# Patient Record
Sex: Female | Born: 2000 | Race: Black or African American | Hispanic: No | Marital: Single | Smoking: Never smoker
Health system: Southern US, Community
[De-identification: ages and names within clinical notes are randomized; demographics above are authoritative.]

## PROBLEM LIST (undated history)

## (undated) ENCOUNTER — Inpatient Hospital Stay (HOSPITAL_COMMUNITY): Payer: Self-pay

## (undated) DIAGNOSIS — J45909 Unspecified asthma, uncomplicated: Secondary | ICD-10-CM

## (undated) DIAGNOSIS — N83209 Unspecified ovarian cyst, unspecified side: Secondary | ICD-10-CM

## (undated) DIAGNOSIS — F32A Depression, unspecified: Secondary | ICD-10-CM

## (undated) HISTORY — PX: NO PAST SURGERIES: SHX2092

## (undated) HISTORY — PX: ECTOPIC PREGNANCY SURGERY: SHX613

---
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
## 2021-01-12 ENCOUNTER — Ambulatory Visit (INDEPENDENT_AMBULATORY_CARE_PROVIDER_SITE_OTHER): Payer: Medicaid Other

## 2021-01-12 DIAGNOSIS — Z659 Problem related to unspecified psychosocial circumstances: Secondary | ICD-10-CM

## 2021-01-12 DIAGNOSIS — O0930 Supervision of pregnancy with insufficient antenatal care, unspecified trimester: Secondary | ICD-10-CM

## 2021-01-12 DIAGNOSIS — Z3402 Encounter for supervision of normal first pregnancy, second trimester: Secondary | ICD-10-CM

## 2021-01-12 DIAGNOSIS — Z3A00 Weeks of gestation of pregnancy not specified: Secondary | ICD-10-CM

## 2021-01-12 DIAGNOSIS — Z3490 Encounter for supervision of normal pregnancy, unspecified, unspecified trimester: Secondary | ICD-10-CM | POA: Insufficient documentation

## 2021-01-12 DIAGNOSIS — Z3400 Encounter for supervision of normal first pregnancy, unspecified trimester: Secondary | ICD-10-CM

## 2021-01-12 MED ORDER — VITAFOL GUMMIES 3.33-0.333-34.8 MG PO CHEW: 1.0000 | CHEWABLE_TABLET | Freq: Every day | ORAL | 11 refills | Status: DC

## 2021-01-12 MED ORDER — BLOOD PRESSURE KIT DEVI: 1.0000 | 0 refills | Status: DC | PRN

## 2021-01-12 NOTE — Progress Notes (Signed)
New OB Intake  I connected with  Brianna Thornton on 01/12/21 at  9:00 AM EDT by telephone visit and verified that I am speaking with the correct person using two identifiers. Nurse is located at [facility] and pt is located at home.  I discussed the limitations, risks, security and privacy concerns of performing an evaluation and management service by telephone and the availability of in person appointments. I also discussed with the patient that there may be a patient responsible charge related to this service. The patient expressed understanding and agreed to proceed.  I explained I am completing New OB Intake today. We discussed her EDD of 06/24/2021 that is based [facility]. Pt will bring ultrasound results to NOB visit. Pt is G1/P0. I reviewed her allergies, medications, Medical/Surgical/OB history, and appropriate screenings. Based on history, this pregnancy is uncomplicated .   Patient Active Problem List   Diagnosis Date Noted   Episodic tension-type headache, not intractable 12/14/2017   Seizure-like activity (HCC) 12/13/2017   Ingrown toenail 03/02/2016    Concerns addressed today:  PHQ9= 7 GAD7= 2 Pt requests counseling services.  Pt needs PNV Rx  Delivery Plans:  Plans to deliver at [facility].   MyChart/Babyscripts MyChart access verified. I explained pt will have some visits in office and some virtually. Babyscripts instructions given and order placed. Patient verifies receipt of registration text/e-mail. Account successfully created and app downloaded.  Blood Pressure Cuff  Blood pressure cuff ordered for patient to pick-up from [employer]. Explained after first prenatal appt pt will check weekly and document in Babyscripts.  Weight scale: Patient does have weight scale.   Anatomy US Explained first scheduled [place] will be around 19 weeks.   Labs Discussed Avelina Laine genetic screening with patient. Would like Panorama drawn at new OB  visit. Routine prenatal labs needed.  Covid Vaccine Patient has covid vaccine.   Social Determinants of Health Food Insecurity: Patient denies food insecurity. WIC Referral: Patient is not interested in referral to [employer].  Transportation: Patient denies transportation needs. Childcare: Discussed no children allowed at ultrasound appointments. Offered childcare services; patient declines childcare services at this time.  Send link to Pregnancy Navigators [facility] [employer] [facility] has a partnership with the [employer] to provide prenatal navigation for the most needed resources in our community. In order to see how we can help connect you to these resources we need consent to contact you. Please complete the very short consent using the link below:   English Link: [email or web address].tfaforms.[email or web address]?[email or web address]   Placed OB Box on problem list and updated  First visit review I reviewed new OB appt with pt. I explained she will have a pelvic exam, ob bloodwork with genetic screening. Explained that patient will be seen by pregnancy navigator following visit with provider. [facility] information placed in AVS.   Dalphine Handing, CMA 01/12/2021  9:22 AM

## 2021-01-16 NOTE — Progress Notes (Signed)
Patient was assessed and managed by nursing staff during this encounter. I have reviewed the chart and agree with the documentation and plan. I have also made any necessary editorial changes.  [name], MD 01/16/2021 10:51 AM

## 2021-01-19 ENCOUNTER — Other Ambulatory Visit: Payer: Self-pay

## 2021-01-19 ENCOUNTER — Other Ambulatory Visit (HOSPITAL_COMMUNITY)
Admission: RE | Admit: 2021-01-19 | Discharge: 2021-01-19 | Disposition: A | Discharge status: Home / Self Care | Payer: Medicaid Other | Source: Ambulatory Visit | Attending: Obstetrics and Gynecology | Admitting: Obstetrics and Gynecology

## 2021-01-19 ENCOUNTER — Encounter: Payer: Self-pay | Admitting: Obstetrics and Gynecology

## 2021-01-19 ENCOUNTER — Ambulatory Visit (INDEPENDENT_AMBULATORY_CARE_PROVIDER_SITE_OTHER): Payer: Medicaid Other | Admitting: Obstetrics and Gynecology

## 2021-01-19 VITALS — BP 95/57 | HR 88 | Wt 95.6 lb

## 2021-01-19 DIAGNOSIS — Z3400 Encounter for supervision of normal first pregnancy, unspecified trimester: Secondary | ICD-10-CM

## 2021-01-19 DIAGNOSIS — Z3A17 17 weeks gestation of pregnancy: Secondary | ICD-10-CM | POA: Diagnosis not present

## 2021-01-19 NOTE — Patient Instructions (Signed)
Second Trimester of Pregnancy °The second trimester of pregnancy is from week 13 through week 27. This is months 4 through 6 of pregnancy. The second trimester is often a time when you feel your best. Your body has adjusted to being pregnant, and you begin to feel better physically. °During the second trimester: °Morning sickness has lessened or stopped completely. °You may have more energy. °You may have an increase in appetite. °The second trimester is also a time when the unborn baby (fetus) is growing rapidly. At the end of the sixth month, the fetus may be up to 12 inches long and weigh about 1½ pounds. You will likely begin to feel the baby move (quickening) between 16 and 20 weeks of pregnancy. °Body changes during your second trimester °Your body continues to go through many changes during your second trimester. The changes vary and generally return to normal after the baby is born. °Physical changes °Your weight will continue to increase. You will notice your lower abdomen bulging out. °You may begin to get stretch marks on your hips, abdomen, and breasts. °Your breasts will continue to grow and to become tender. °Dark spots or blotches (chloasma or mask of pregnancy) may develop on your face. °A dark line from your belly button to the pubic area (linea nigra) may appear. °You may have changes in your hair. These can include thickening of your hair, rapid growth, and changes in texture. Some people also have hair loss during or after pregnancy, or hair that feels dry or thin. °Health changes °You may develop headaches. °You may have heartburn. °You may develop constipation. °You may develop hemorrhoids or swollen, bulging veins (varicose veins). °Your gums may bleed and may be sensitive to brushing and flossing. °You may urinate more often because the fetus is pressing on your bladder. °You may have back pain. This is caused by: °Weight gain. °Pregnancy hormones that are relaxing the joints in your  pelvis. °A shift in weight and the muscles that support your balance. °Follow these instructions at home: °Medicines °Follow your health care provider's instructions regarding medicine use. Specific medicines may be either safe or unsafe to take during pregnancy. Do not take any medicines unless approved by your health care provider. °Take a prenatal vitamin that contains at least 600 micrograms (mcg) of folic acid. °Eating and drinking °Eat a healthy diet that includes fresh fruits and vegetables, whole grains, good sources of protein such as meat, eggs, or tofu, and low-fat dairy products. °Avoid raw meat and unpasteurized juice, milk, and cheese. These carry germs that can harm you and your baby. °You may need to take these actions to prevent or treat constipation: °Drink enough fluid to keep your urine pale yellow. °Eat foods that are high in fiber, such as beans, whole grains, and fresh fruits and vegetables. °Limit foods that are high in fat and processed sugars, such as fried or sweet foods. °Activity °Exercise only as directed by your health care provider. Most people can continue their usual exercise routine during pregnancy. Try to exercise for 30 minutes at least 5 days a week. Stop exercising if you develop contractions in your uterus. °Stop exercising if you develop pain or cramping in the lower abdomen or lower back. °Avoid exercising if it is very hot or humid or if you are at a high altitude. °Avoid heavy lifting. °If you choose to, you may have sex unless your health care provider tells you not to. °Relieving pain and discomfort °Wear a supportive bra   to prevent discomfort from breast tenderness. °Take warm sitz baths to soothe any pain or discomfort caused by hemorrhoids. Use hemorrhoid cream if your health care provider approves. °Rest with your legs raised (elevated) if you have leg cramps or low back pain. °If you develop varicose veins: °Wear support hose as told by your health care  provider. °Elevate your feet for 15 minutes, 3-4 times a day. °Limit salt in your diet. °Safety °Wear your seat belt at all times when driving or riding in a car. °Talk with your health care provider if someone is verbally or physically abusive to you. °Lifestyle °Do not use hot tubs, steam rooms, or saunas. °Do not douche. Do not use tampons or scented sanitary pads. °Avoid cat litter boxes and soil used by cats. These carry germs that can cause birth defects in the baby and possibly loss of the fetus by miscarriage or stillbirth. °Do not use herbal remedies, alcohol, illegal drugs, or medicines that are not approved by your health care provider. Chemicals in these products can harm your baby. °Do not use any products that contain nicotine or tobacco, such as cigarettes, e-cigarettes, and chewing tobacco. If you need help quitting, ask your health care provider. °General instructions °During a routine prenatal visit, your health care provider will do a physical exam and other tests. He or she will also discuss your overall health. Keep all follow-up visits. This is important. °Ask your health care provider for a referral to a local prenatal education class. °Ask for help if you have counseling or nutritional needs during pregnancy. Your health care provider can offer advice or refer you to specialists for help with various needs. °Where to find more information °American Pregnancy Association: americanpregnancy.org °American College of Obstetricians and Gynecologists: acog.org/en/Womens%20Health/Pregnancy °Office on Women's Health: womenshealth.gov/pregnancy °Contact a health care provider if you have: °A headache that does not go away when you take medicine. °Vision changes or you see spots in front of your eyes. °Mild pelvic cramps, pelvic pressure, or nagging pain in the abdominal area. °Persistent nausea, vomiting, or diarrhea. °A bad-smelling vaginal discharge or foul-smelling urine. °Pain when you  urinate. °Sudden or extreme swelling of your face, hands, ankles, feet, or legs. °A fever. °Get help right away if you: °Have fluid leaking from your vagina. °Have spotting or bleeding from your vagina. °Have severe abdominal cramping or pain. °Have difficulty breathing. °Have chest pain. °Have fainting spells. °Have not felt your baby move for the time period told by your health care provider. °Have new or increased pain, swelling, or redness in an arm or leg. °Summary °The second trimester of pregnancy is from week 13 through week 27 (months 4 through 6). °Do not use herbal remedies, alcohol, illegal drugs, or medicines that are not approved by your health care provider. Chemicals in these products can harm your baby. °Exercise only as directed by your health care provider. Most people can continue their usual exercise routine during pregnancy. °Keep all follow-up visits. This is important. °This information is not intended to replace advice given to you by your health care provider. Make sure you discuss any questions you have with your health care provider. °Document Revised: 08/19/2019 Document Reviewed: 06/25/2019 °Elsevier Patient Education © 2022 [employer] ° °Contraception Choices °Contraception, also called birth control, refers to methods or devices that prevent pregnancy. °Hormonal methods °Contraceptive implant °A contraceptive implant is a thin, plastic tube that contains a hormone that prevents pregnancy. It is different from an intrauterine device (IUD). It   is inserted into the upper part of the arm by a health care provider. Implants can be effective for up to 3 years. °Progestin-only injections °Progestin-only injections are injections of progestin, a synthetic form of the hormone progesterone. They are given every 3 months by a health care provider. °Birth control pills °Birth control pills are pills that contain hormones that prevent pregnancy. They must be taken once a day, preferably at the  same time each day. A prescription is needed to use this method of contraception. °Birth control patch °The birth control patch contains hormones that prevent pregnancy. It is placed on the skin and must be changed once a week for three weeks and removed on the fourth week. A prescription is needed to use this method of contraception. °Vaginal ring °A vaginal ring contains hormones that prevent pregnancy. It is placed in the vagina for three weeks and removed on the fourth week. After that, the process is repeated with a new ring. A prescription is needed to use this method of contraception. °Emergency contraceptive °Emergency contraceptives prevent pregnancy after unprotected sex. They come in pill form and can be taken up to 5 days after sex. They work best the sooner they are taken after having sex. Most emergency contraceptives are available without a prescription. This method should not be used as your only form of birth control. °Barrier methods °Female condom °A female condom is a thin sheath that is worn over the penis during sex. Condoms keep sperm from going inside a woman's body. They can be used with a sperm-killing substance (spermicide) to increase their effectiveness. They should be thrown away after one use. °Female condom °A female condom is a soft, loose-fitting sheath that is put into the vagina before sex. The condom keeps sperm from going inside a woman's body. They should be thrown away after one use. °Diaphragm °A diaphragm is a soft, dome-shaped barrier. It is inserted into the vagina before sex, along with a spermicide. The diaphragm blocks sperm from entering the uterus, and the spermicide kills sperm. A diaphragm should be left in the vagina for 6-8 hours after sex and removed within 24 hours. °A diaphragm is prescribed and fitted by a health care provider. A diaphragm should be replaced every 1-2 years, after giving birth, after gaining more than 15 lb (6.8 kg), and after pelvic  surgery. °Cervical cap °A cervical cap is a round, soft latex or plastic cup that fits over the cervix. It is inserted into the vagina before sex, along with spermicide. It blocks sperm from entering the uterus. The cap should be left in place for 6-8 hours after sex and removed within 48 hours. A cervical cap must be prescribed and fitted by a health care provider. It should be replaced every 2 years. °Sponge °A sponge is a soft, circular piece of polyurethane foam with spermicide in it. The sponge helps block sperm from entering the uterus, and the spermicide kills sperm. To use it, you make it wet and then insert it into the vagina. It should be inserted before sex, left in for at least 6 hours after sex, and removed and thrown away within 30 hours. °Spermicides °Spermicides are chemicals that kill or block sperm from entering the cervix and uterus. They can come as a cream, jelly, suppository, foam, or tablet. A spermicide should be inserted into the vagina with an applicator at least 10-15 minutes before sex to allow time for it to work. The process must be repeated every time   you have sex. Spermicides do not require a prescription. °Intrauterine contraception °Intrauterine device (IUD) °An IUD is a T-shaped device that is put in a woman's uterus. There are two types: °Hormone IUD.This type contains progestin, a synthetic form of the hormone progesterone. This type can stay in place for 3-5 years. °Copper IUD.This type is wrapped in copper wire. It can stay in place for 10 years. °Permanent methods of contraception °Female tubal ligation °In this method, a woman's fallopian tubes are sealed, tied, or blocked during surgery to prevent eggs from traveling to the uterus. °Hysteroscopic sterilization °In this method, a small, flexible insert is placed into each fallopian tube. The inserts cause scar tissue to form in the fallopian tubes and block them, so sperm cannot reach an egg. The procedure takes about 3  months to be effective. Another form of birth control must be used during those 3 months. °Female sterilization °This is a procedure to tie off the tubes that carry sperm (vasectomy). After the procedure, the man can still ejaculate fluid (semen). Another form of birth control must be used for 3 months after the procedure. °Natural planning methods °Natural family planning °In this method, a couple does not have sex on days when the woman could become pregnant. °Calendar method °In this method, the woman keeps track of the length of each menstrual cycle, identifies the days when pregnancy can happen, and does not have sex on those days. °Ovulation method °In this method, a couple avoids sex during ovulation. °Symptothermal method °This method involves not having sex during ovulation. The woman typically checks for ovulation by watching changes in her temperature and in the consistency of cervical mucus. °Post-ovulation method °In this method, a couple waits to have sex until after ovulation. °Where to find more information °Centers for Disease Control and Prevention: www.cdc.gov °Summary °Contraception, also called birth control, refers to methods or devices that prevent pregnancy. °Hormonal methods of contraception include implants, injections, pills, patches, vaginal rings, and emergency contraceptives. °Barrier methods of contraception can include female condoms, female condoms, diaphragms, cervical caps, sponges, and spermicides. °There are two types of IUDs (intrauterine devices). An IUD can be put in a woman's uterus to prevent pregnancy for 3-5 years. °Permanent sterilization can be done through a procedure for males and females. Natural family planning methods involve nothaving sex on days when the woman could become pregnant. °This information is not intended to replace advice given to you by your health care provider. Make sure you discuss any questions you have with your health care provider. °Document  Revised: 08/17/2019 Document Reviewed: 08/17/2019 °Elsevier Patient Education © 2022 Else[employer] Inc. ° °

## 2021-01-19 NOTE — Progress Notes (Signed)
Pt presents for NOB visit without complaints today. Flu vaccine declined

## 2021-01-19 NOTE — Progress Notes (Signed)
  Subjective:    Brianna Thornton is a G1P0 [redacted]w[redacted]d being seen today for her first obstetrical visit.  Her obstetrical history is significant for first pregnancy. Patient does intend to breast feed. Pregnancy history fully reviewed.  Patient reports no complaints.  Vitals:   01/19/21 0856  BP: (!) 95/57  Pulse: 88  Weight: 95 lb 9.6 oz (43.4 kg)    HISTORY: OB History  Gravida Para Term Preterm AB Living  1            SAB IAB Ectopic Multiple Live Births               # Outcome Date GA Lbr Len/2nd Weight Sex Delivery Anes PTL Lv  1 Current            Past Medical History:  Diagnosis Date  . Asthma    History reviewed. No pertinent surgical history. History reviewed. No pertinent family history.   Exam    Uterus:   18-weeks  Pelvic Exam:    Perineum: No Hemorrhoids, Normal Perineum   Vulva: normal   Vagina:  normal mucosa, normal discharge   pH:    Cervix: nulliparous appearance and cervix is closed and long   Adnexa: no mass, fullness, tenderness   Bony Pelvis: gynecoid  System: Breast:  normal appearance, no masses or tenderness   Skin: normal coloration and turgor, no rashes    Neurologic: oriented, no focal deficits   Extremities: normal strength, tone, and muscle mass   HEENT extra ocular movement intact   Mouth/Teeth mucous membranes moist, pharynx normal without lesions and dental hygiene good   Neck supple and no masses   Cardiovascular: regular rate and rhythm   Respiratory:  appears well, vitals normal, no respiratory distress, acyanotic, normal RR, chest clear, no wheezing, crepitations, rhonchi, normal symmetric air entry   Abdomen: soft, non-tender; bowel sounds normal; no masses,  no organomegaly   Urinary:       Assessment:    Pregnancy: G1P0 Patient Active Problem List   Diagnosis Date Noted  . Encounter for supervision of normal pregnancy, antepartum 01/12/2021  . Episodic tension-type headache, not intractable 12/14/2017  . Seizure-like  activity (HCC) 12/13/2017  . Ingrown toenail 03/02/2016        Plan:     Initial labs drawn. Prenatal vitamins. Problem list reviewed and updated. Genetic Screening discussed : panorama and carrier typing ordered.  Ultrasound discussed; fetal survey: scheduled 11/15.  Follow up in 4 weeks. 50% of 30 min visit spent on counseling and coordination of care.     Mandel Seiden 01/19/2021

## 2021-01-20 ENCOUNTER — Encounter: Payer: Medicaid Other | Admitting: Licensed Clinical Social Worker

## 2021-01-20 ENCOUNTER — Telehealth: Payer: Self-pay | Admitting: Licensed Clinical Social Worker

## 2021-01-20 NOTE — Telephone Encounter (Signed)
Called pt regarding scheduled mychart visit. Left message requesting callback  

## 2021-01-21 LAB — AFP, SERUM, OPEN SPINA BIFIDA
AFP MoM: 0.77
AFP Value: 49.8 ng/mL
Gest. Age on Collection Date: 17.5 weeks
Maternal Age At EDD: 19.4 yr
OSBR Risk 1 IN: 10000
Test Results:: NEGATIVE
Weight: 95 [lb_av]

## 2021-01-21 LAB — CBC/D/PLT+RPR+RH+ABO+RUBIGG...
Antibody Screen: NEGATIVE
Basophils Absolute: 0 10*3/uL (ref 0.0–0.2)
Basos: 0 %
EOS (ABSOLUTE): 0.2 10*3/uL (ref 0.0–0.4)
Eos: 2 %
HCV Ab: <0.1 s/co ratio (ref 0.0–0.9)
HIV Screen 4th Generation wRfx: NONREACTIVE
Hematocrit: 35.8 % (ref 34.0–46.6)
Hemoglobin: 11.8 g/dL (ref 11.1–15.9)
Hepatitis B Surface Ag: NEGATIVE
Immature Grans (Abs): 0 10*3/uL (ref 0.0–0.1)
Immature Granulocytes: 0 %
Lymphocytes Absolute: 1.7 10*3/uL (ref 0.7–3.1)
Lymphs: 19 %
MCH: 29.4 pg (ref 26.6–33.0)
MCHC: 33 g/dL (ref 31.5–35.7)
MCV: 89 fL (ref 79–97)
Monocytes Absolute: 0.5 10*3/uL (ref 0.1–0.9)
Monocytes: 6 %
Neutrophils Absolute: 6.7 10*3/uL (ref 1.4–7.0)
Neutrophils: 73 %
Platelets: 359 10*3/uL (ref 150–450)
RBC: 4.02 x10E6/uL (ref 3.77–5.28)
RDW: 12.7 % (ref 11.7–15.4)
RPR Ser Ql: NONREACTIVE
Rh Factor: POSITIVE
Rubella Antibodies, IGG: 2.44 index (ref >0.99)
WBC: 9.2 10*3/uL (ref 3.4–10.8)

## 2021-01-21 LAB — HCV INTERPRETATION

## 2021-01-21 LAB — CULTURE, OB URINE

## 2021-01-21 LAB — URINE CULTURE, OB REFLEX

## 2021-01-23 LAB — CERVICOVAGINAL ANCILLARY ONLY
Bacterial Vaginitis (gardnerella): POSITIVE — AB
Candida Glabrata: NEGATIVE
Candida Vaginitis: NEGATIVE
Chlamydia: NEGATIVE
Comment: NEGATIVE
Comment: NEGATIVE
Comment: NEGATIVE
Comment: NEGATIVE
Comment: NEGATIVE
Comment: NORMAL
Neisseria Gonorrhea: NEGATIVE
Trichomonas: NEGATIVE

## 2021-01-23 MED ORDER — METRONIDAZOLE 500 MG PO TABS
500.0000 mg | ORAL_TABLET | Freq: Two times a day (BID) | ORAL | 0 refills | Status: DC
Start: 2021-01-23 — End: 2021-03-23

## 2021-01-23 NOTE — Addendum Note (Signed)
Addended by: [name] on: 01/23/2021 02:16 PM   Modules accepted: Orders

## 2021-01-26 ENCOUNTER — Encounter: Payer: Self-pay | Admitting: Obstetrics and Gynecology

## 2021-01-30 ENCOUNTER — Encounter: Payer: Self-pay | Admitting: Obstetrics and Gynecology

## 2021-02-01 ENCOUNTER — Inpatient Hospital Stay (HOSPITAL_COMMUNITY): Payer: Medicaid Other

## 2021-02-01 ENCOUNTER — Encounter (HOSPITAL_COMMUNITY): Payer: Self-pay | Admitting: Emergency Medicine

## 2021-02-01 ENCOUNTER — Inpatient Hospital Stay (HOSPITAL_COMMUNITY)
Admission: EM | Admit: 2021-02-01 | Discharge: 2021-02-01 | Disposition: A | Discharge status: Home / Self Care | Payer: Medicaid Other | Attending: Obstetrics and Gynecology | Admitting: Obstetrics and Gynecology

## 2021-02-01 DIAGNOSIS — R748 Abnormal levels of other serum enzymes: Secondary | ICD-10-CM | POA: Diagnosis not present

## 2021-02-01 DIAGNOSIS — R1030 Lower abdominal pain, unspecified: Secondary | ICD-10-CM | POA: Diagnosis present

## 2021-02-01 DIAGNOSIS — O26892 Other specified pregnancy related conditions, second trimester: Secondary | ICD-10-CM

## 2021-02-01 DIAGNOSIS — O98512 Other viral diseases complicating pregnancy, second trimester: Secondary | ICD-10-CM | POA: Insufficient documentation

## 2021-02-01 DIAGNOSIS — U071 COVID-19: Secondary | ICD-10-CM | POA: Diagnosis not present

## 2021-02-01 DIAGNOSIS — R109 Unspecified abdominal pain: Secondary | ICD-10-CM

## 2021-02-01 DIAGNOSIS — Z3A19 19 weeks gestation of pregnancy: Secondary | ICD-10-CM

## 2021-02-01 HISTORY — DX: Depression, unspecified: F32.A

## 2021-02-01 HISTORY — DX: Unspecified ovarian cyst, unspecified side: N83.209

## 2021-02-01 LAB — CBC WITH DIFFERENTIAL/PLATELET
Abs Immature Granulocytes: 0.04 10*3/uL (ref 0.00–0.07)
Basophils Absolute: 0 10*3/uL (ref 0.0–0.1)
Basophils Relative: 0 %
Eosinophils Absolute: 0.1 10*3/uL (ref 0.0–0.5)
Eosinophils Relative: 1 %
HCT: 30.1 % — ABNORMAL LOW (ref 36.0–46.0)
Hemoglobin: 10 g/dL — ABNORMAL LOW (ref 12.0–15.0)
Immature Granulocytes: 1 %
Lymphocytes Relative: 3 %
Lymphs Abs: 0.3 10*3/uL — ABNORMAL LOW (ref 0.7–4.0)
MCH: 30.1 pg (ref 26.0–34.0)
MCHC: 33.2 g/dL (ref 30.0–36.0)
MCV: 90.7 fL (ref 80.0–100.0)
Monocytes Absolute: 0.6 10*3/uL (ref 0.1–1.0)
Monocytes Relative: 7 %
Neutro Abs: 7.2 10*3/uL (ref 1.7–7.7)
Neutrophils Relative %: 88 %
Platelets: 241 10*3/uL (ref 150–400)
RBC: 3.32 MIL/uL — ABNORMAL LOW (ref 3.87–5.11)
RDW: 13.6 % (ref 11.5–15.5)
WBC: 8.2 10*3/uL (ref 4.0–10.5)
nRBC: 0 % (ref 0.0–0.2)

## 2021-02-01 LAB — URINALYSIS, ROUTINE W REFLEX MICROSCOPIC
Bilirubin Urine: NEGATIVE
Glucose, UA: NEGATIVE mg/dL
Hgb urine dipstick: NEGATIVE
Ketones, ur: 20 mg/dL — AB
Leukocytes,Ua: NEGATIVE
Nitrite: NEGATIVE
Protein, ur: NEGATIVE mg/dL
Specific Gravity, Urine: 1.009 (ref 1.005–1.030)
pH: 7 (ref 5.0–8.0)

## 2021-02-01 LAB — COMPREHENSIVE METABOLIC PANEL
ALT: 63 U/L — ABNORMAL HIGH (ref 0–44)
AST: 69 U/L — ABNORMAL HIGH (ref 15–41)
Albumin: 3.1 g/dL — ABNORMAL LOW (ref 3.5–5.0)
Alkaline Phosphatase: 46 U/L (ref 38–126)
Anion gap: 7 (ref 5–15)
BUN: <5 mg/dL — ABNORMAL LOW (ref 6–20)
CO2: 21 mmol/L — ABNORMAL LOW (ref 22–32)
Calcium: 8.4 mg/dL — ABNORMAL LOW (ref 8.9–10.3)
Chloride: 104 mmol/L (ref 98–111)
Creatinine, Ser: 0.44 mg/dL (ref 0.44–1.00)
GFR, Estimated: >60 mL/min (ref >60)
Glucose, Bld: 76 mg/dL (ref 70–99)
Potassium: 3.6 mmol/L (ref 3.5–5.1)
Sodium: 132 mmol/L — ABNORMAL LOW (ref 135–145)
Total Bilirubin: 0.4 mg/dL (ref 0.3–1.2)
Total Protein: 5.8 g/dL — ABNORMAL LOW (ref 6.5–8.1)

## 2021-02-01 LAB — HEPATITIS PANEL, ACUTE
HCV Ab: NONREACTIVE
Hep A IgM: NONREACTIVE
Hep B C IgM: NONREACTIVE
Hepatitis B Surface Ag: NONREACTIVE

## 2021-02-01 LAB — RESP PANEL BY RT-PCR (FLU A&B, COVID) ARPGX2
Influenza A by PCR: NEGATIVE
Influenza B by PCR: NEGATIVE
SARS Coronavirus 2 by RT PCR: POSITIVE — AB

## 2021-02-01 LAB — TROPONIN I (HIGH SENSITIVITY)
Troponin I (High Sensitivity): <2 ng/L (ref <18)
Troponin I (High Sensitivity): <2 ng/L (ref <18)

## 2021-02-01 LAB — LIPASE, BLOOD: Lipase: 20 U/L (ref 11–51)

## 2021-02-01 IMAGING — MR MR ABDOMEN W/O CM
13 of 14 series · 39 of 48 positions shown · non-contrast
Comparison: None.

CLINICAL DATA: Lower abdominal pain since this a.m.

EXAM:
MRI ABDOMEN AND PELVIS WITHOUT CONTRAST
TECHNIQUE: Multiplanar multisequence MR imaging of the abdomen and pelvis was
performed. No intravenous contrast was administered.

[Series 4: cor haste · coronal · 5.0mm · 1.04mm/px · 3 of 35 slices shown]
[im 1/35]
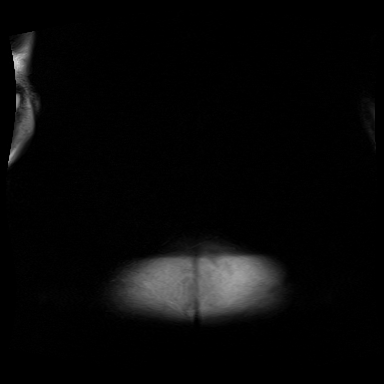
[im 18/35]
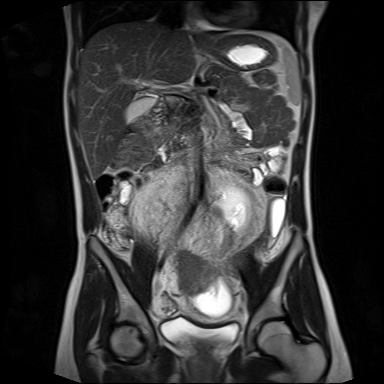
[im 35/35]
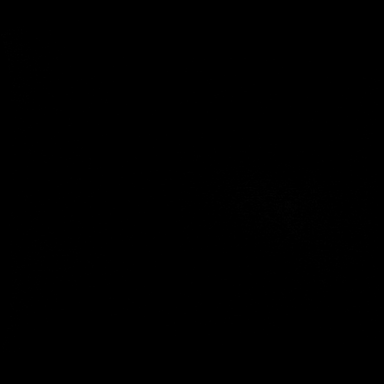

[Series 7: cor haste fs · coronal · 5.0mm · 1.04mm/px · 2 of 19 slices shown (1 of 2)]
[im 1/19]
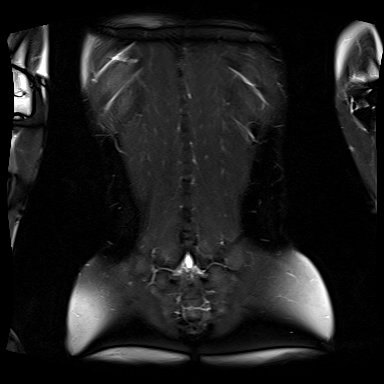
[im 19/19]
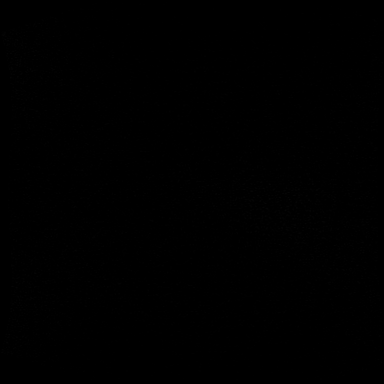

[Series 8: bSSFP · coronal · 5.0mm · 1.61mm/px · 3 of 35 slices shown (1 of 3)]
[im 1/35]
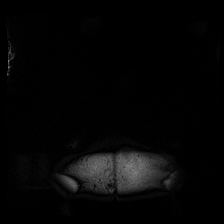
[im 18/35]
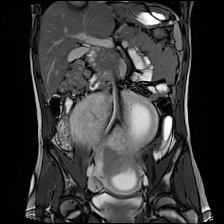
[im 35/35]
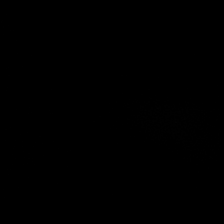

[Series 15: ax haste_comp · axial · 5.0mm · 1.19mm/px · z∈[-418,-220]mm · 3 of 34 slices shown (1 of 2)]
[im 1/34]
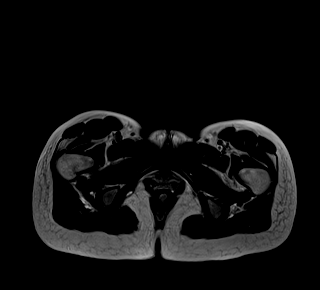
[im 17/34]
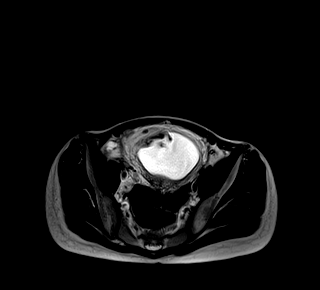
[im 34/34]
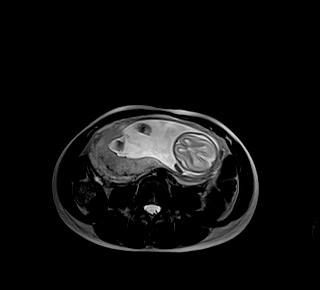

[Series 15: ax haste_comp · axial · 5.0mm · 0.99mm/px · z∈[-216,-30]mm · 3 of 32 slices shown (2 of 2)]
[im 1/32]
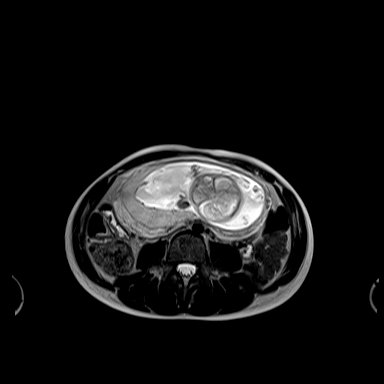
[im 16/32]
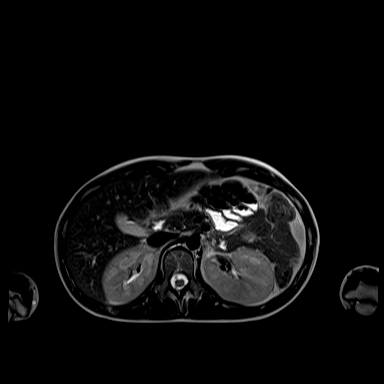
[im 32/32]
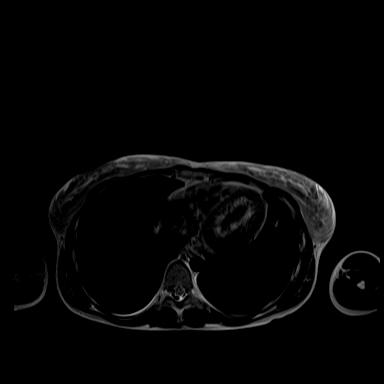

[Series 16: cor haste fs · coronal · 5.0mm · 1.04mm/px · 3 of 34 slices shown (2 of 2)]
[im 1/34]
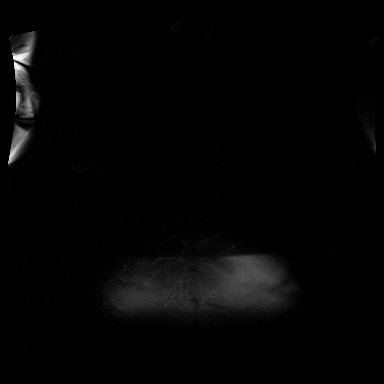
[im 17/34]
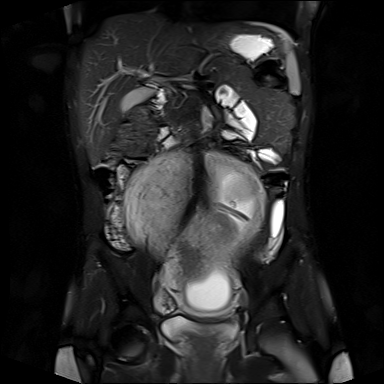
[im 34/34]
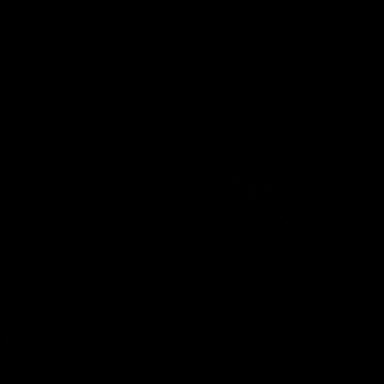

[Series 21: ax haste fs_comp · axial · 5.0mm · 1.19mm/px · z∈[-418,-220]mm · 3 of 34 slices shown (1 of 2)]
[im 1/34]
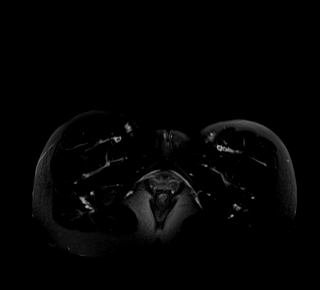
[im 17/34]
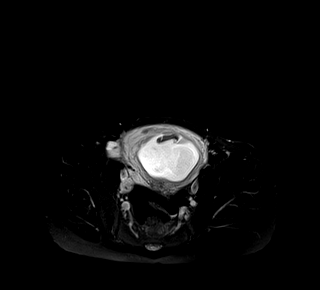
[im 34/34]
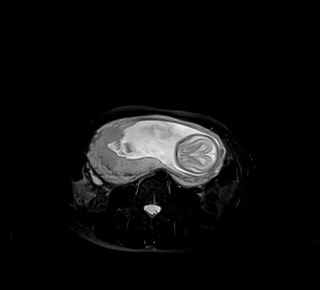

[Series 21: ax haste fs_comp · axial · 5.0mm · 0.99mm/px · z∈[-216,-30]mm · 3 of 32 slices shown (2 of 2)]
[im 1/32]
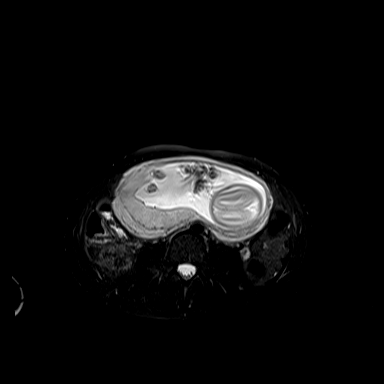
[im 16/32]
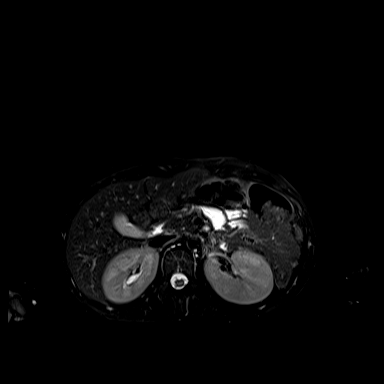
[im 32/32]
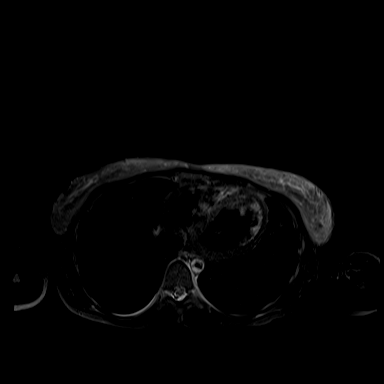

[Series 24: bSSFP · axial · 5.0mm · 1.61mm/px · z∈[-216,-30]mm · 3 of 32 slices shown (2 of 3)]
[im 1/32]
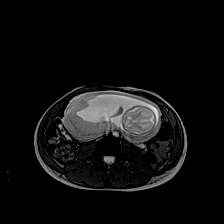
[im 16/32]
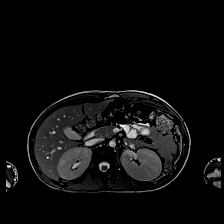
[im 32/32]
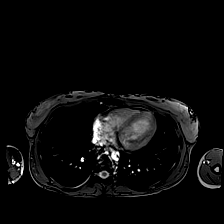

[Series 24: bSSFP · axial · 5.0mm · 0.74mm/px · z∈[-418,-220]mm · 3 of 34 slices shown (3 of 3)]
[im 1/34]
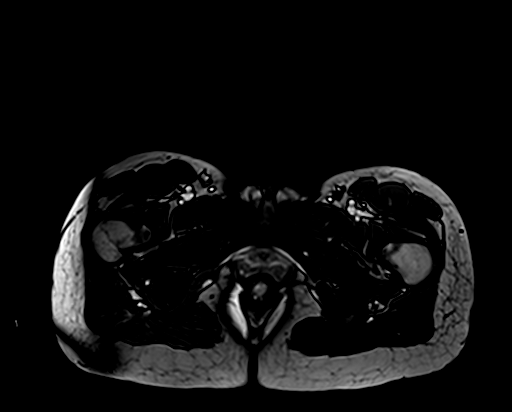
[im 17/34]
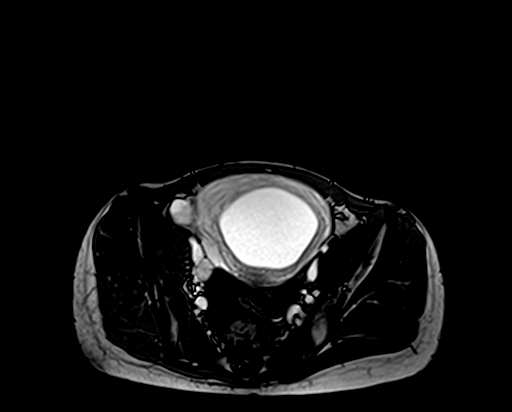
[im 34/34]
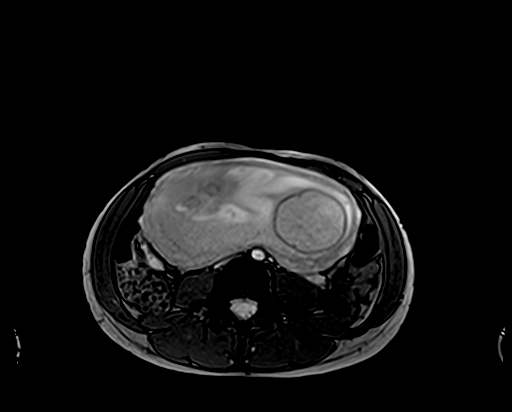

[Series 27: T1 · axial · 5.0mm · 0.70mm/px · z∈[-216,-30]mm · 3 of 32 slices shown (1 of 2)]
[im 1/32]
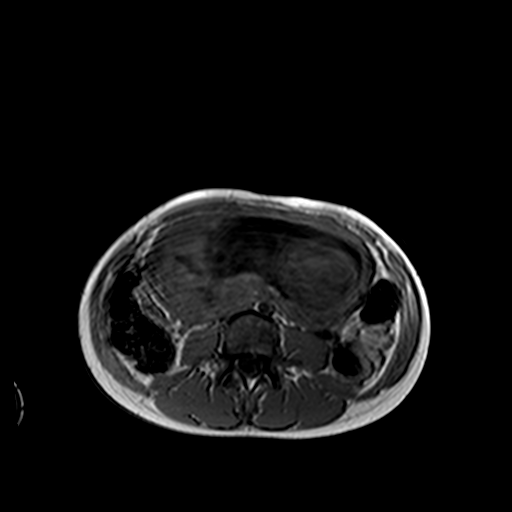
[im 16/32]
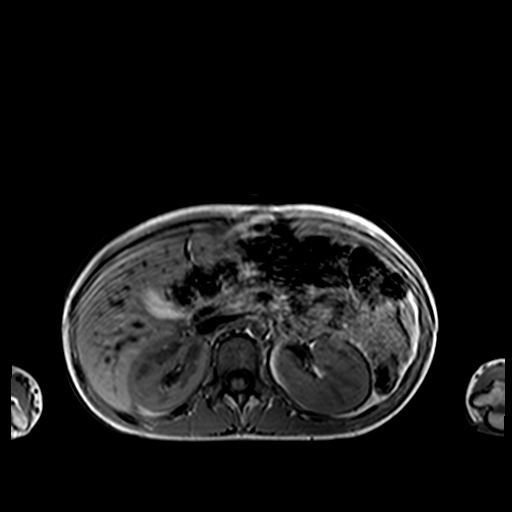
[im 32/32]
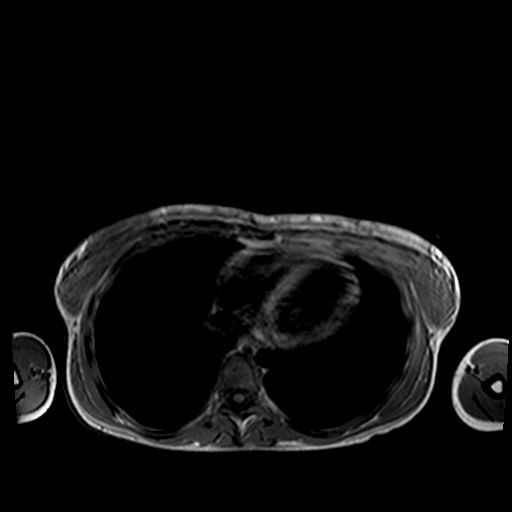

[Series 27: T1 · axial · 5.0mm · 1.48mm/px · z∈[-418,-220]mm · 3 of 34 slices shown (2 of 2)]
[im 1/34]
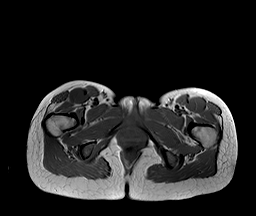
[im 17/34]
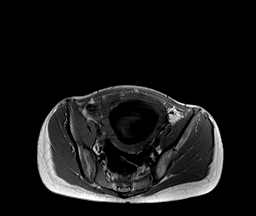
[im 34/34]
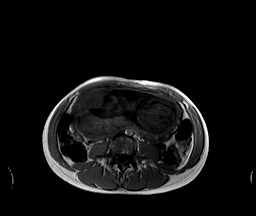

[Series 28: T1 dynamic · axial · 3.0mm · 1.41mm/px · z∈[-218,-107]mm · 4 of 64 slices shown]
[im 1/64]
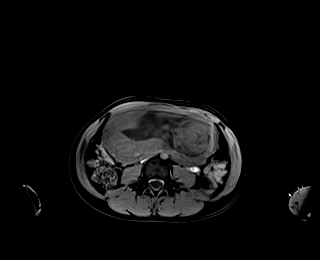
[im 13/64]
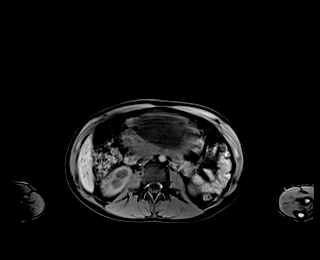
[im 26/64]
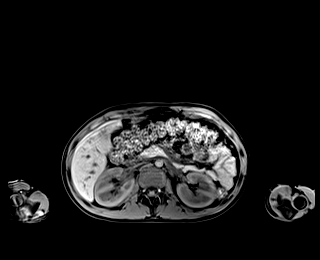
[im 38/64]
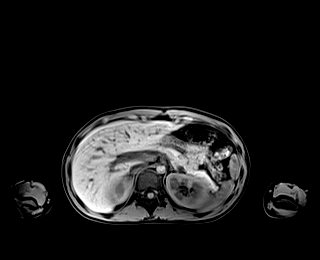

[39 of 48 positions shown; findings below may reference images not displayed]

FINDINGS: COMBINED FINDINGS FOR BOTH MR ABDOMEN AND PELVIS

Lower chest: No acute abnormality.

Hepatobiliary: Unremarkable noncontrast appearance of the hepatic
parenchyma. Gallbladder is unremarkable. No biliary ductal
dilation.

Pancreas: No mass, inflammatory changes, or other parenchymal
abnormality identified.

Spleen:  Within normal limits in size and appearance.

Adrenals/Urinary Tract: No masses identified. No evidence of
hydronephrosis.

Stomach/Bowel: Stomach is unremarkable for degree of distension. No
pathologic dilation of small or large bowel. Visualized portion of
the appendix appears normal for instance on image [id number]. No
pericecal inflammation.

Vascular/Lymphatic: No pathologically enlarged lymph nodes
identified. No abdominal aortic aneurysm demonstrated.

Reproductive: Single intrauterine pregnancy in breech presentation,
incompletely evaluated on this study as protocol. 2 cm left ovarian
cyst.

Other:  Physiologic volume of pelvic free fluid.

Musculoskeletal: No suspicious bone lesions identified.
IMPRESSION: 1. No evidence of acute appendicitis with the visualized portions of
the appendix appear normal and no pericecal inflammation.
2. No other acute findings are identified within the abdomen or
pelvis.
3. Single intrauterine pregnancy, incompletely evaluated on this
study as protocol.

## 2021-02-01 IMAGING — MR MR PELVIS W/O CM
13 of 14 series · 39 of 48 positions shown · non-contrast
Comparison: None.

CLINICAL DATA: Lower abdominal pain since this a.m.

EXAM:
MRI ABDOMEN AND PELVIS WITHOUT CONTRAST
TECHNIQUE: Multiplanar multisequence MR imaging of the abdomen and pelvis was
performed. No intravenous contrast was administered.

[Series 4: cor haste · coronal · 5.0mm · 1.04mm/px · 3 of 35 slices shown]
[im 1/35]
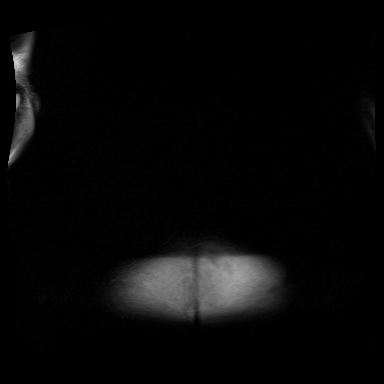
[im 18/35]
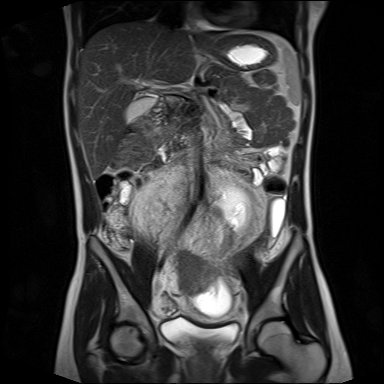
[im 35/35]
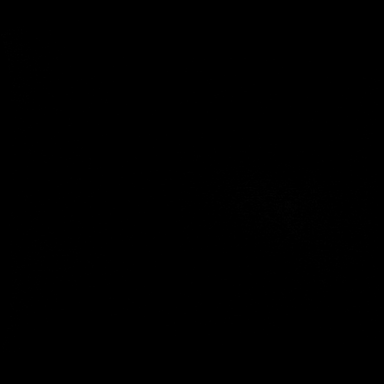

[Series 7: cor haste fs · coronal · 5.0mm · 1.04mm/px · 2 of 19 slices shown (1 of 2)]
[im 1/19]
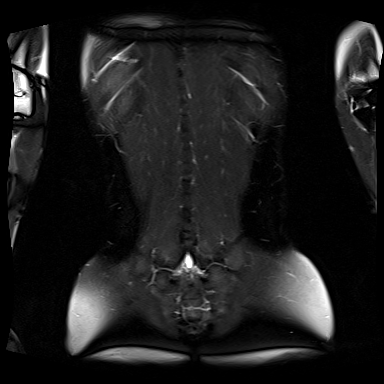
[im 19/19]
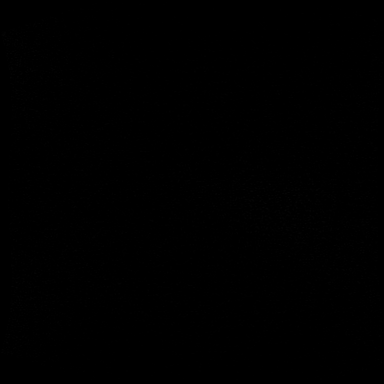

[Series 8: bSSFP · coronal · 5.0mm · 1.61mm/px · 3 of 35 slices shown (1 of 3)]
[im 1/35]
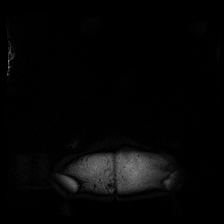
[im 18/35]
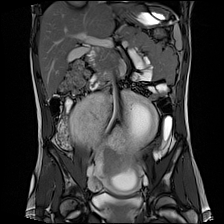
[im 35/35]
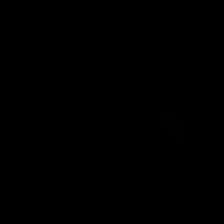

[Series 15: ax haste_comp · axial · 5.0mm · 1.19mm/px · z∈[-418,-220]mm · 3 of 34 slices shown (1 of 2)]
[im 1/34]
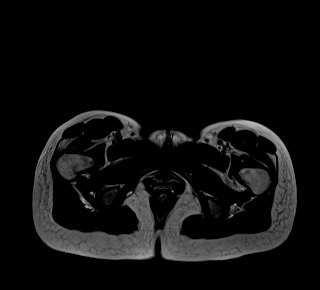
[im 17/34]
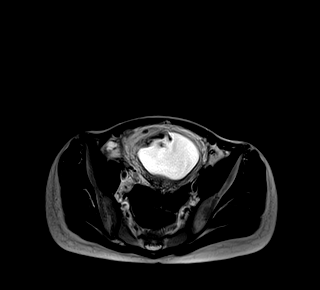
[im 34/34]
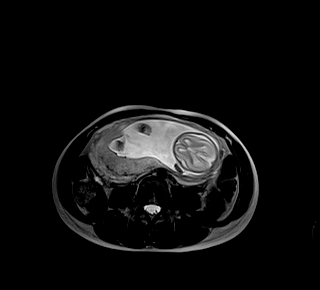

[Series 15: ax haste_comp · axial · 5.0mm · 0.99mm/px · z∈[-216,-30]mm · 3 of 32 slices shown (2 of 2)]
[im 1/32]
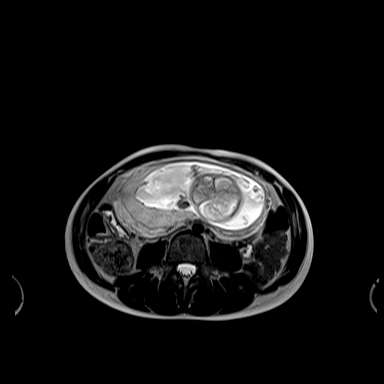
[im 16/32]
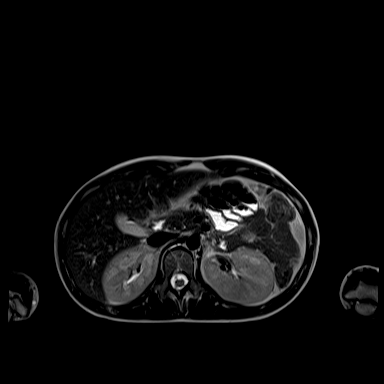
[im 32/32]
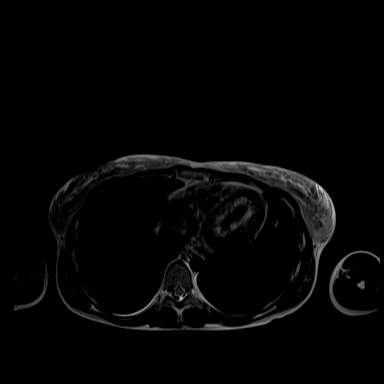

[Series 16: cor haste fs · coronal · 5.0mm · 1.04mm/px · 3 of 34 slices shown (2 of 2)]
[im 1/34]
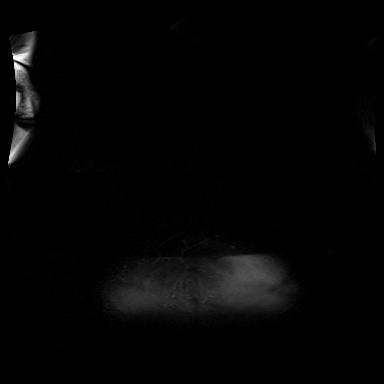
[im 17/34]
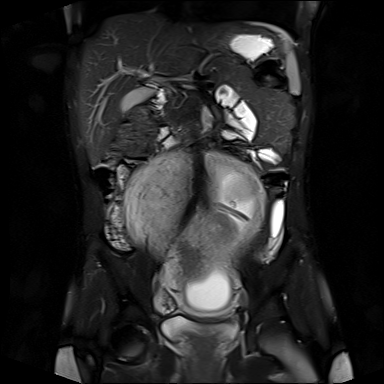
[im 34/34]
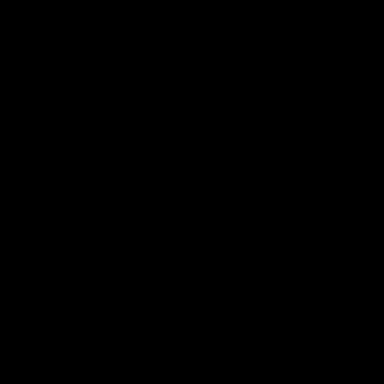

[Series 21: ax haste fs_comp · axial · 5.0mm · 0.99mm/px · z∈[-216,-30]mm · 3 of 32 slices shown (1 of 2)]
[im 1/32]
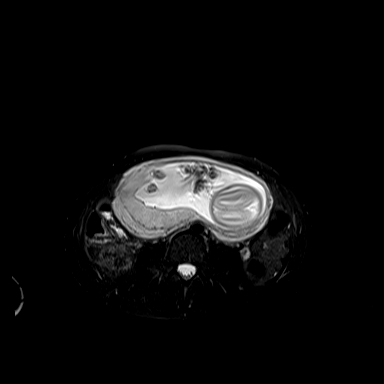
[im 16/32]
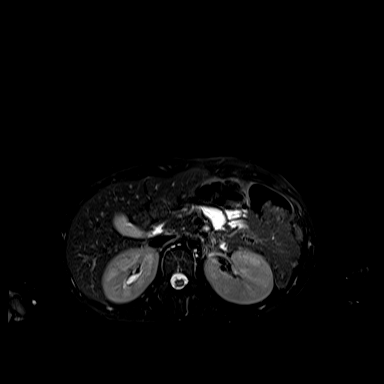
[im 32/32]
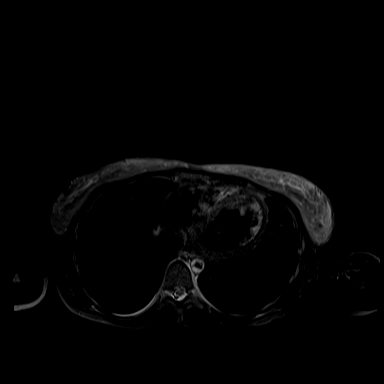

[Series 21: ax haste fs_comp · axial · 5.0mm · 1.19mm/px · z∈[-418,-220]mm · 3 of 34 slices shown (2 of 2)]
[im 1/34]
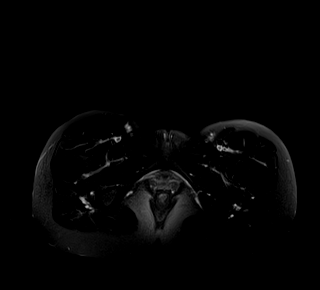
[im 17/34]
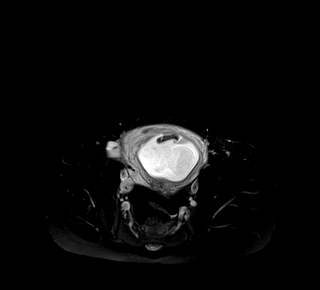
[im 34/34]
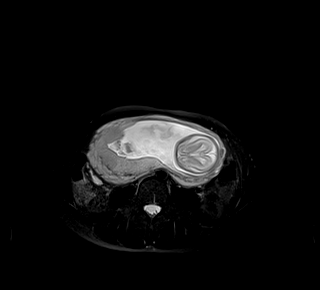

[Series 24: bSSFP · axial · 5.0mm · 1.61mm/px · z∈[-216,-30]mm · 3 of 32 slices shown (2 of 3)]
[im 1/32]
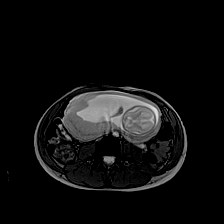
[im 16/32]
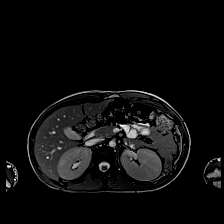
[im 32/32]
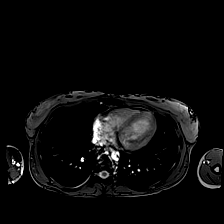

[Series 24: bSSFP · axial · 5.0mm · 0.74mm/px · z∈[-418,-220]mm · 3 of 34 slices shown (3 of 3)]
[im 1/34]
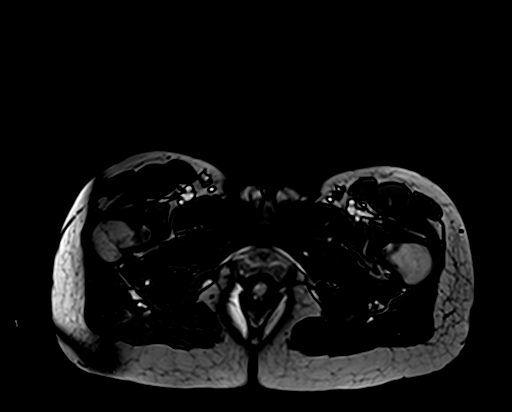
[im 17/34]
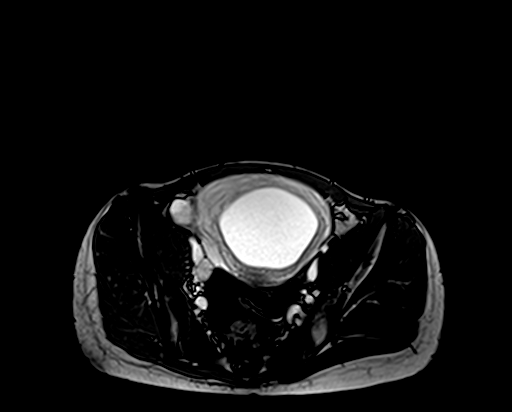
[im 34/34]
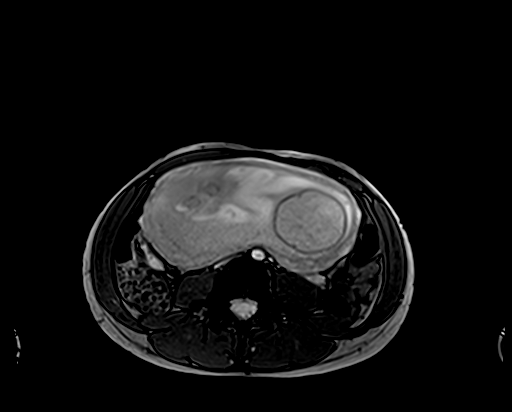

[Series 27: T1 · axial · 5.0mm · 1.48mm/px · z∈[-418,-220]mm · 3 of 34 slices shown (1 of 2)]
[im 1/34]
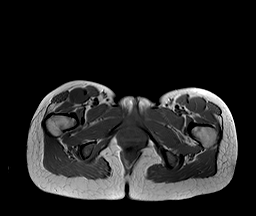
[im 17/34]
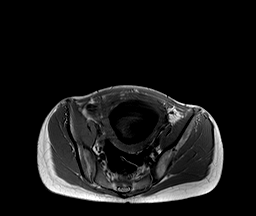
[im 34/34]
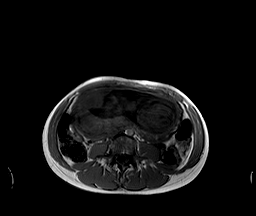

[Series 27: T1 · axial · 5.0mm · 0.70mm/px · z∈[-216,-30]mm · 3 of 32 slices shown (2 of 2)]
[im 1/32]
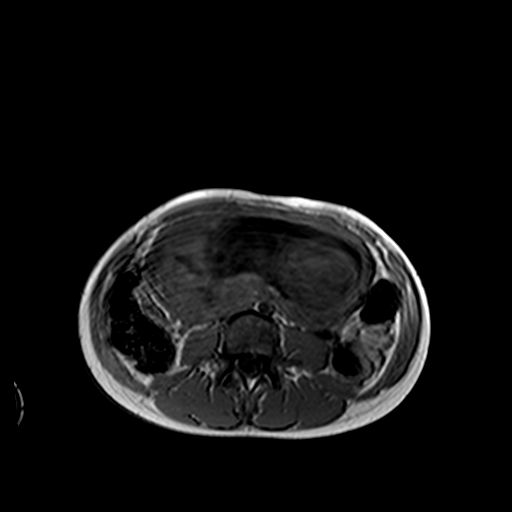
[im 16/32]
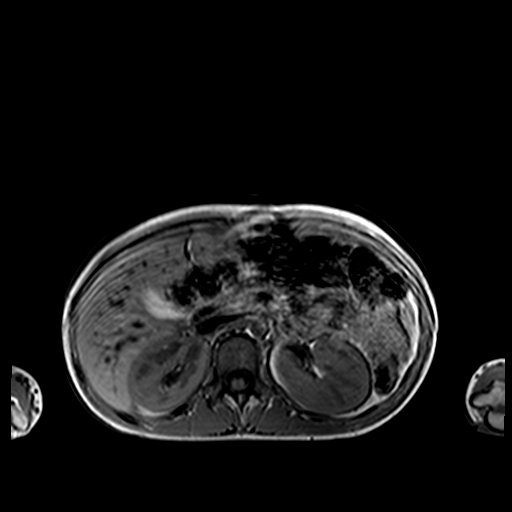
[im 32/32]
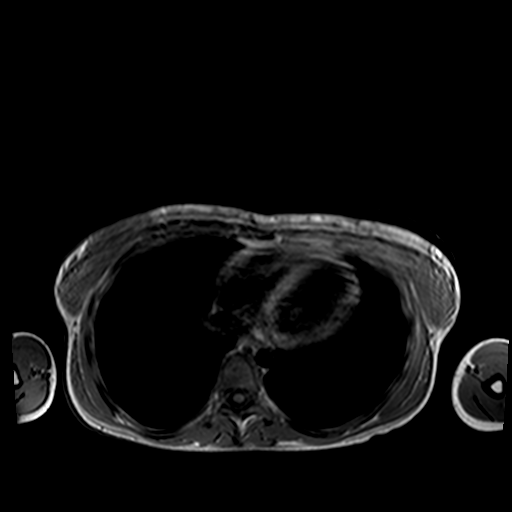

[Series 28: T1 dynamic · axial · 3.0mm · 1.41mm/px · z∈[-218,-107]mm · 4 of 64 slices shown]
[im 1/64]
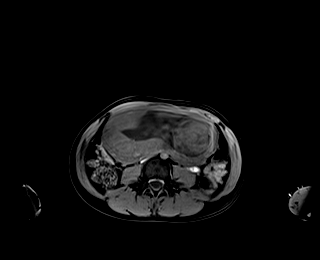
[im 13/64]
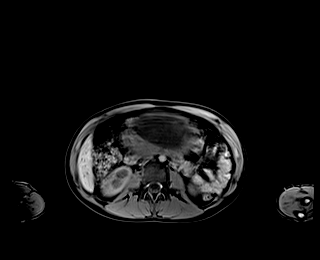
[im 26/64]
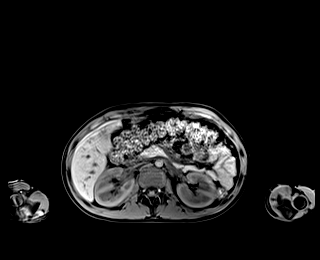
[im 38/64]
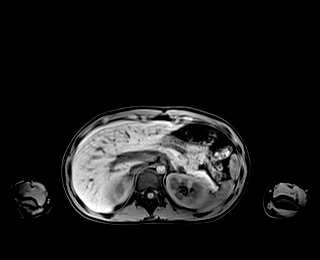

[39 of 48 positions shown; findings below may reference images not displayed]

FINDINGS: COMBINED FINDINGS FOR BOTH MR ABDOMEN AND PELVIS

Lower chest: No acute abnormality.

Hepatobiliary: Unremarkable noncontrast appearance of the hepatic
parenchyma. Gallbladder is unremarkable. No biliary ductal
dilation.

Pancreas: No mass, inflammatory changes, or other parenchymal
abnormality identified.

Spleen:  Within normal limits in size and appearance.

Adrenals/Urinary Tract: No masses identified. No evidence of
hydronephrosis.

Stomach/Bowel: Stomach is unremarkable for degree of distension. No
pathologic dilation of small or large bowel. Visualized portion of
the appendix appears normal for instance on image [id number]. No
pericecal inflammation.

Vascular/Lymphatic: No pathologically enlarged lymph nodes
identified. No abdominal aortic aneurysm demonstrated.

Reproductive: Single intrauterine pregnancy in breech presentation,
incompletely evaluated on this study as protocol. 2 cm left ovarian
cyst.

Other:  Physiologic volume of pelvic free fluid.

Musculoskeletal: No suspicious bone lesions identified.
IMPRESSION: 1. No evidence of acute appendicitis with the visualized portions of
the appendix appear normal and no pericecal inflammation.
2. No other acute findings are identified within the abdomen or
pelvis.
3. Single intrauterine pregnancy, incompletely evaluated on this
study as protocol.

## 2021-02-01 MED ORDER — LACTATED RINGERS IV SOLN: INTRAVENOUS | Status: DC

## 2021-02-01 MED ORDER — ACETAMINOPHEN 500 MG PO TABS
1000.0000 mg | ORAL_TABLET | Freq: Once | ORAL | Status: AC
  Administered 2021-02-01: 1000 mg via ORAL
  Filled 2021-02-01: qty 2

## 2021-02-01 NOTE — ED Triage Notes (Signed)
Patient who is [redacted] weeks pregnant complains of lower abdominal pain that started this morning. Patient also complains of nausea but denies emesis. Patient alert, oriented, and in no apparent distress at this time.

## 2021-02-01 NOTE — MAU Note (Signed)
Didn't feel good when woke up this morning. Was already having pain in lower stomach into her back.  Pain got worse when she got to work, started getting light headed.  Boss told her she looked pale, so she left work and just went to the ER.  Denies fever or resp problems. Has been feeling hot then cold. Abd is tender to touch

## 2021-02-01 NOTE — ED Provider Notes (Signed)
[facility] EMERGENCY DEPARTMENT Provider Note   CSN: [id number] Arrival date & time: 02/01/21  1019     History Chief Complaint  Patient presents with   Abdominal Pain   Nausea   light headed    Brianna Thornton is a 20 y.o. female.  20 year old female presents with complaint of lower abdominal pain onset this morning upon waking, progressively worsening with pain across her back.  Patient is reportedly [redacted] weeks pregnant, G3, P0.  Denies vaginal discharge, bleeding, leaking fluid.  Patient was at work today, her supervisor noticed that she was pale and did not appear to feel well and recommended she come to the ER.  Patient states that she has felt hot and cold off and on with some chills but denies cough, congestion, sick symptoms otherwise.  States that she has a little bit of discomfort in her left lower chest which is worse if she takes a deep breath, has been ongoing for several years and states that she is used to just dealing with it.  Patient mentioned this discomfort in triage and it was decided that she would remain in the emergency room for her work up as opposed to going to MAU for lower abdominal pain in pregnancy.       Past Medical History:  Diagnosis Date   Asthma    Depression    therapist said bipolar, pt does not want to take meds   Ovarian cyst     Patient Active Problem List   Diagnosis Date Noted   Encounter for supervision of normal pregnancy, antepartum 01/12/2021   Episodic tension-type headache, not intractable 12/14/2017   Seizure-like activity ([place]) 12/13/2017   Ingrown toenail 03/02/2016    Past Surgical History:  Procedure Laterality Date   NO PAST SURGERIES       OB History     Gravida  3   Para  0   Term      Preterm      AB  2   Living         SAB  2   IAB      Ectopic      Multiple      Live Births              Family History  Problem Relation Age of Onset   Bipolar disorder Mother    Healthy  Father     Social History   Tobacco Use   Smoking status: Never   Smokeless tobacco: Never  Substance Use Topics   Alcohol use: No   Drug use: Never    Home Medications Prior to Admission medications   Medication Sig Start Date End Date Taking? Authorizing Provider  metroNIDAZOLE (FLAGYL) 500 MG tablet Take 1 tablet (500 mg total) by mouth 2 (two) times daily. 01/23/21  Yes Constant, [name], MD  Prenatal Vit-Fe Phos-FA-Omega (VITAFOL GUMMIES) 3.33-0.333-34.8 MG CHEW Chew 1 tablet by mouth daily. 01/12/21  Yes Constant, [name], MD  Blood Pressure Monitoring (BLOOD PRESSURE KIT) DEVI 1 Device by Does not apply route as needed. 01/12/21   Constant, [name], MD  Vitamin D, Ergocalciferol, (DRISDOL) 50000 units CAPS capsule  12/07/17   [provider]    Allergies    Patient has no known allergies.  Review of Systems   Review of Systems  Constitutional:  Positive for chills and diaphoresis. Negative for fever.  HENT:  Negative for congestion.   Respiratory:  Negative for cough.   Cardiovascular:  Positive for chest pain.  Gastrointestinal:  Positive for abdominal pain. Negative for constipation, diarrhea, nausea and vomiting.  Genitourinary:  Negative for dysuria, vaginal bleeding and vaginal discharge.  Musculoskeletal:  Positive for back pain.  Skin:  Negative for rash and wound.  Allergic/Immunologic: Positive for immunocompromised state.  Neurological:  Negative for weakness.  Psychiatric/Behavioral:  Negative for confusion.   All other systems reviewed and are negative.  Physical Exam Updated Vital Signs BP (!) 103/52 (BP Location: Right Arm)   Pulse (!) 107   Temp (!) 100.7 F (38.2 C) (Oral)   Resp 18   Ht 5' 1"  (1.549 m)   Wt 44.9 kg   LMP 10/21/2020   SpO2 100%   BMI 18.69 kg/m   Physical Exam Vitals and nursing note reviewed.  Constitutional:      General: She is not in acute distress.    Appearance: She is well-developed. She is not ill-appearing.   HENT:     Head: Normocephalic and atraumatic.  Cardiovascular:     Rate and Rhythm: Regular rhythm. Tachycardia present.     Comments: HR 106 at time of exam Pulmonary:     Effort: Pulmonary effort is normal.     Breath sounds: Normal breath sounds.  Abdominal:     Palpations: Abdomen is soft.     Tenderness: There is abdominal tenderness in the right lower quadrant, suprapubic area and left lower quadrant. There is no right CVA tenderness or left CVA tenderness.  Musculoskeletal:       Back:  Skin:    General: Skin is warm and dry.     Findings: No erythema or rash.  Neurological:     Mental Status: She is alert and oriented to person, place, and time.  Psychiatric:        Behavior: Behavior normal.    ED Results / Procedures / Treatments   Labs (all labs ordered are listed, but only abnormal results are displayed) Labs Reviewed  CBC WITH DIFFERENTIAL/PLATELET - Abnormal; Notable for the following components:      Result Value   RBC 3.32 (*)    Hemoglobin 10.0 (*)    HCT 30.1 (*)    Lymphs Abs 0.3 (*)    All other components within normal limits  COMPREHENSIVE METABOLIC PANEL - Abnormal; Notable for the following components:   Sodium 132 (*)    CO2 21 (*)    BUN <5 (*)    Calcium 8.4 (*)    Total Protein 5.8 (*)    Albumin 3.1 (*)    AST 69 (*)    ALT 63 (*)    All other components within normal limits  RESP PANEL BY RT-PCR (FLU A&B, COVID) ARPGX2  LIPASE, BLOOD  URINALYSIS, ROUTINE W REFLEX MICROSCOPIC  TROPONIN I (HIGH SENSITIVITY)  TROPONIN I (HIGH SENSITIVITY)    EKG None  Radiology No results found.  Procedures Procedures   Medications Ordered in ED Medications - No data to display  ED Course  I have reviewed the triage vital signs and the nursing notes.  Pertinent labs & imaging results that were available during my care of the patient were reviewed by me and considered in my medical decision making (see chart for details).  Clinical Course  as of 02/01/21 1433  Wed Feb 01, 6822  [id number] 20 year old female with complaint of lower abdominal pain as above, onset this morning. On arrival in triage, mentioned CP which prompted ER work up rather than transfer  to MAU. On further questioning, patient has had this chest discomfort for at least 1 year, it is unchanged. Her trop is negative. CXR deferred due to pregnancy.  [LM]  1216 Call to [facility], APP in MAU, patient will be sent to MAU for the remainder of her work-up today. [LM]    Clinical Course User Index [LM] Roque Lias   MDM Rules/Calculators/A&P                           Final Clinical Impression(s) / ED Diagnoses Final diagnoses:  Abdominal pain during pregnancy in second trimester    Rx / DC Orders ED Discharge Orders     None        Tacy Learn, PA-C 02/01/21 1433    Davonna Belling, MD 02/01/21 1919

## 2021-02-01 NOTE — MAU Provider Note (Signed)
History     CSN: [id number]  Arrival date and time: 02/01/21 1019   None     Chief Complaint  Patient presents with   Abdominal Pain   Nausea   light headed   HPI Brianna Thornton is a 20 y.o. G3P0020 at [redacted]w[redacted]d who presents today for lower abdominal and back pain. Patient reports pain started while she was at work this morning. Reports that she felt lightheaded and nauseous and that her boss told her that she looked pale and made her leave work. She reports that certain movements such as turning over make the pain worse. She has not tried anything to relieve pain. She endorses some chills, but has not checked temperature. Reports that her co-worker has a cough, but today was the first time that she has been around her. Otherwise no other sick contacts. No congestion, cough, or sore throat. Patient denies vomiting, urinary s/s, vaginal bleeding, discharge, itching, or odor. Currently taking flagyl for BV.   OB History     Gravida  3   Para  0   Term      Preterm      AB  2   Living         SAB  2   IAB      Ectopic      Multiple      Live Births              Past Medical History:  Diagnosis Date   Asthma    Depression    therapist said bipolar, pt does not want to take meds   Ovarian cyst     Past Surgical History:  Procedure Laterality Date   NO PAST SURGERIES      Family History  Problem Relation Age of Onset   Bipolar disorder Mother    Healthy Father     Social History   Tobacco Use   Smoking status: Never   Smokeless tobacco: Never  Substance Use Topics   Alcohol use: No   Drug use: Never    Allergies: No Known Allergies  No medications prior to admission.    Review of Systems  Constitutional:  Positive for chills and fatigue. Negative for fever.  HENT: Negative.    Respiratory: Negative.    Cardiovascular: Negative.   Gastrointestinal:  Positive for abdominal pain and nausea. Negative for constipation, diarrhea and vomiting.   Genitourinary: Negative.   Musculoskeletal:  Positive for back pain.  Neurological: Negative.   Psychiatric/Behavioral: Negative.    Physical Exam   Blood pressure (!) 102/52, pulse 91, temperature 98.4 F (36.9 C), temperature source Oral, resp. rate 16, height 5\' 1"  (1.549 m), weight 44.9 kg, last menstrual period 10/21/2020, SpO2 100 %.  Physical Exam Vitals and nursing note reviewed. Exam conducted with a chaperone present.  Constitutional:      General: She is not in acute distress.    Appearance: She is normal weight.  Cardiovascular:     Rate and Rhythm: Tachycardia present.  Pulmonary:     Effort: Pulmonary effort is normal.  Abdominal:     General: Abdomen is flat.     Palpations: Abdomen is soft.     Tenderness: There is abdominal tenderness in the right lower quadrant and left lower quadrant.  Skin:    General: Skin is warm.  Neurological:     General: No focal deficit present.     Mental Status: She is alert and oriented to person, place, and time.  Psychiatric:        Mood and Affect: Mood normal.        Behavior: Behavior normal.  Cervix: closed/thick  MR PELVIS WO CONTRAST  Result Date: 02/01/2021 CLINICAL DATA:  Lower abdominal pain since this a.m. EXAM: MRI ABDOMEN AND PELVIS WITHOUT CONTRAST TECHNIQUE: Multiplanar multisequence MR imaging of the abdomen and pelvis was performed. No intravenous contrast was administered. COMPARISON:  None. FINDINGS: COMBINED FINDINGS FOR BOTH MR ABDOMEN AND PELVIS Lower chest: No acute abnormality. Hepatobiliary: Unremarkable noncontrast appearance of the hepatic parenchyma. Gallbladder is unremarkable. No biliary ductal dilation. Pancreas: No mass, inflammatory changes, or other parenchymal abnormality identified. Spleen:  Within normal limits in size and appearance. Adrenals/Urinary Tract: No masses identified. No evidence of hydronephrosis. Stomach/Bowel: Stomach is unremarkable for degree of distension. No pathologic dilation  of small or large bowel. Visualized portion of the appendix appears normal for instance on image 61-64/28. No pericecal inflammation. Vascular/Lymphatic: No pathologically enlarged lymph nodes identified. No abdominal aortic aneurysm demonstrated. Reproductive: Single intrauterine pregnancy in breech presentation, incompletely evaluated on this study as protocol. 2 cm left ovarian cyst. Other:  Physiologic volume of pelvic free fluid. Musculoskeletal: No suspicious bone lesions identified. IMPRESSION: 1. No evidence of acute appendicitis with the visualized portions of the appendix appear normal and no pericecal inflammation. 2. No other acute findings are identified within the abdomen or pelvis. 3. Single intrauterine pregnancy, incompletely evaluated on this study as protocol. Electronically Signed   By: Maudry Mayhew M.D.   On: 02/01/2021 17:53   MR ABDOMEN WO CONTRAST  Result Date: 02/01/2021 CLINICAL DATA:  Lower abdominal pain since this a.m. EXAM: MRI ABDOMEN AND PELVIS WITHOUT CONTRAST TECHNIQUE: Multiplanar multisequence MR imaging of the abdomen and pelvis was performed. No intravenous contrast was administered. COMPARISON:  None. FINDINGS: COMBINED FINDINGS FOR BOTH MR ABDOMEN AND PELVIS Lower chest: No acute abnormality. Hepatobiliary: Unremarkable noncontrast appearance of the hepatic parenchyma. Gallbladder is unremarkable. No biliary ductal dilation. Pancreas: No mass, inflammatory changes, or other parenchymal abnormality identified. Spleen:  Within normal limits in size and appearance. Adrenals/Urinary Tract: No masses identified. No evidence of hydronephrosis. Stomach/Bowel: Stomach is unremarkable for degree of distension. No pathologic dilation of small or large bowel. Visualized portion of the appendix appears normal for instance on image 61-64/28. No pericecal inflammation. Vascular/Lymphatic: No pathologically enlarged lymph nodes identified. No abdominal aortic aneurysm demonstrated.  Reproductive: Single intrauterine pregnancy in breech presentation, incompletely evaluated on this study as protocol. 2 cm left ovarian cyst. Other:  Physiologic volume of pelvic free fluid. Musculoskeletal: No suspicious bone lesions identified. IMPRESSION: 1. No evidence of acute appendicitis with the visualized portions of the appendix appear normal and no pericecal inflammation. 2. No other acute findings are identified within the abdomen or pelvis. 3. Single intrauterine pregnancy, incompletely evaluated on this study as protocol. Electronically Signed   By: Maudry Mayhew M.D.   On: 02/01/2021 17:53   MAU Course  Procedures  MDM Patient was previously seen in [facility] for evaluation for ongoing chest pain but sent to MAU for further evaluation of abdominal pain Upon arrival to MAU patient noted to be febrile at 100.7 and tachycardic IVF bolus and Tylenol 1000mg  PO ordered, temp down to 98.4 UA unremarkable CBC with mild anemia, Lipase, and Troponin wnl.  CMP with elevated AST/ALT, hepatitis panel pending Covid/Flu: Covid positive, Flu negative MRI abdomen/pelvis: unremarkable Pain improved from 8/10 to 4/10 Paxlovid was offered to patient, but she declines  Assessment and Plan  [redacted] weeks  gestation of pregnancy Abdominal pain in pregnancy Covid in pregnancy Elevated liver enzymes   - Discharge home in stable condition - Instructed to go home and quarantine, rest and hydrate, symptom management with OTC meds  - Reviewed strict return precautions with patient - Patient to follow up at [facility] as scheduled. May return to MAU as needed or for worsening symptoms    Brand Males, CNM 02/01/2021, 6:43 PM

## 2021-02-01 NOTE — ED Provider Notes (Signed)
Emergency Medicine Provider Triage Evaluation Note  Brianna Thornton , a 20 y.o. female  was evaluated in triage.  Pt complains of lower abdominal pain. G3P0 at [redacted] weeks gestation. She endorses nausea, but no vomiting. Abdominal pain radiates to back. She also endorses some central chest pain associated with shortness of breath. She follows Dr. Jolayne Panther with OBGYN and had an ultrasound performed around [redacted] weeks gestation which confirmed IUP per patient.   Review of Systems  Positive: Abdominal pain, nausea, chest pain, shortness of breath Negative:   Physical Exam  BP (!) 103/58 (BP Location: Right Arm)   Pulse (!) 127   Temp 99 F (37.2 C) (Oral)   Resp (!) 23   LMP 10/21/2020   SpO2 100%  Gen:   Awake, no distress   Resp:  Normal effort  MSK:   Moves extremities without difficulty  Other:  Tachycardic in triage  Medical Decision Making  Medically screening exam initiated at 10:45 AM.  Appropriate orders placed.  Brianna Thornton was informed that the remainder of the evaluation will be completed by another provider, this initial triage assessment does not replace that evaluation, and the importance of remaining in the ED until their evaluation is complete.  Given tachycardia with chest pain and shortness of breath, will keep in the ED rather than send to MAU. Cardiac labs ordered- will defer CXR to provider in back given pregnancy Abdominal labs ordered COVID/flu test   Mannie Stabile, PA-C 02/01/21 1050    Kommor, [place], MD 02/01/21 1622

## 2021-02-07 ENCOUNTER — Other Ambulatory Visit: Payer: Self-pay | Admitting: Obstetrics and Gynecology

## 2021-02-07 ENCOUNTER — Other Ambulatory Visit: Payer: Self-pay

## 2021-02-07 ENCOUNTER — Ambulatory Visit: Payer: Medicaid Other | Attending: Obstetrics and Gynecology

## 2021-02-07 ENCOUNTER — Other Ambulatory Visit: Payer: Self-pay | Admitting: *Deleted

## 2021-02-07 DIAGNOSIS — Z3A19 19 weeks gestation of pregnancy: Secondary | ICD-10-CM | POA: Diagnosis not present

## 2021-02-07 DIAGNOSIS — Z3402 Encounter for supervision of normal first pregnancy, second trimester: Secondary | ICD-10-CM

## 2021-02-07 DIAGNOSIS — Z363 Encounter for antenatal screening for malformations: Secondary | ICD-10-CM | POA: Insufficient documentation

## 2021-02-07 DIAGNOSIS — O2692 Pregnancy related conditions, unspecified, second trimester: Secondary | ICD-10-CM | POA: Insufficient documentation

## 2021-02-07 DIAGNOSIS — Z3689 Encounter for other specified antenatal screening: Secondary | ICD-10-CM

## 2021-02-07 DIAGNOSIS — O98912 Unspecified maternal infectious and parasitic disease complicating pregnancy, second trimester: Secondary | ICD-10-CM | POA: Diagnosis present

## 2021-02-07 DIAGNOSIS — O2690 Pregnancy related conditions, unspecified, unspecified trimester: Secondary | ICD-10-CM | POA: Diagnosis not present

## 2021-02-07 DIAGNOSIS — O99891 Other specified diseases and conditions complicating pregnancy: Secondary | ICD-10-CM | POA: Insufficient documentation

## 2021-02-07 DIAGNOSIS — J45909 Unspecified asthma, uncomplicated: Secondary | ICD-10-CM | POA: Insufficient documentation

## 2021-02-07 IMAGING — US US MFM OB DETAIL+14 WK
1 series · 13 of 28 positions shown · non-contrast
Comparison: none

[Series 1: us mfm ob detail+14 wk · 128 acquisitions, 13 frames shown]
[im 5/128]
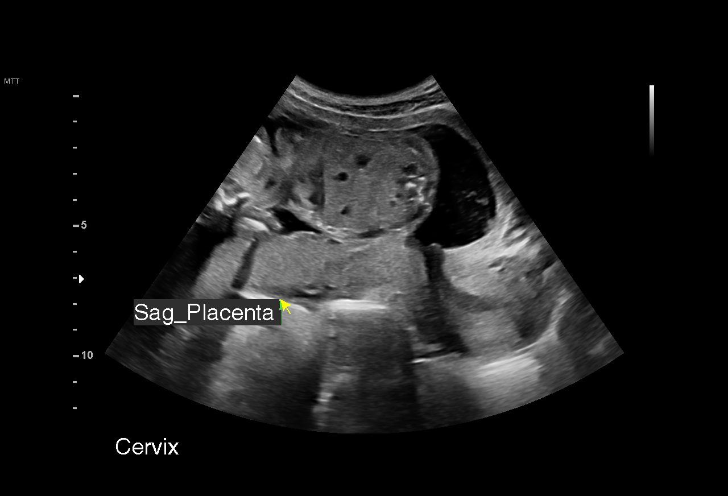
[im 15/128]
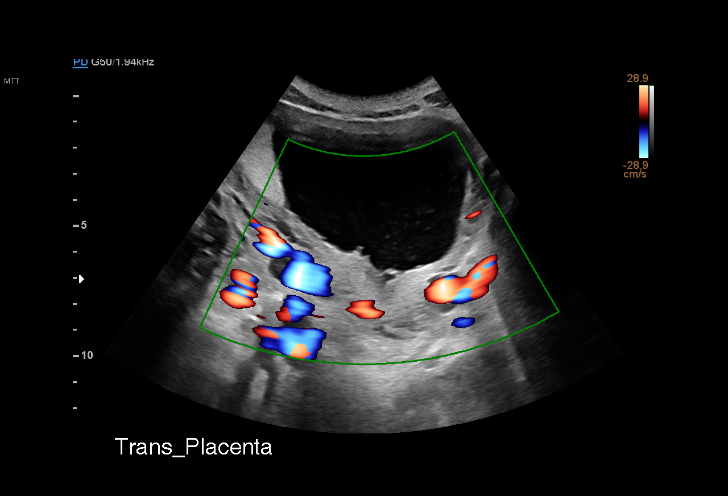
[im 24/128]
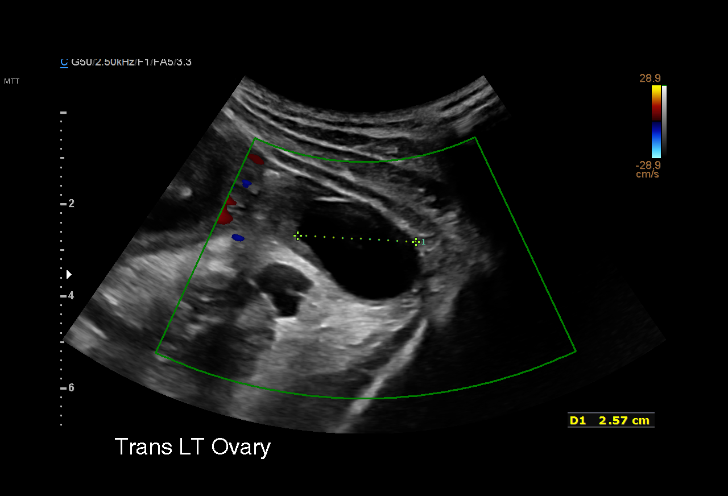
[im 33/128]
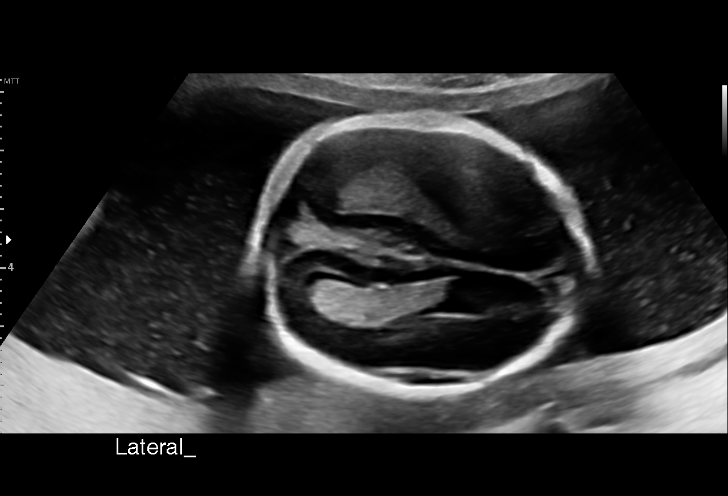
[im 43/128]
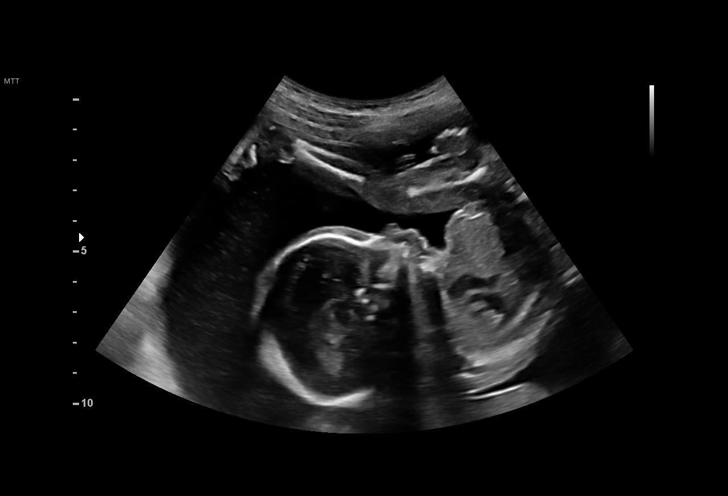
[im 52/128]
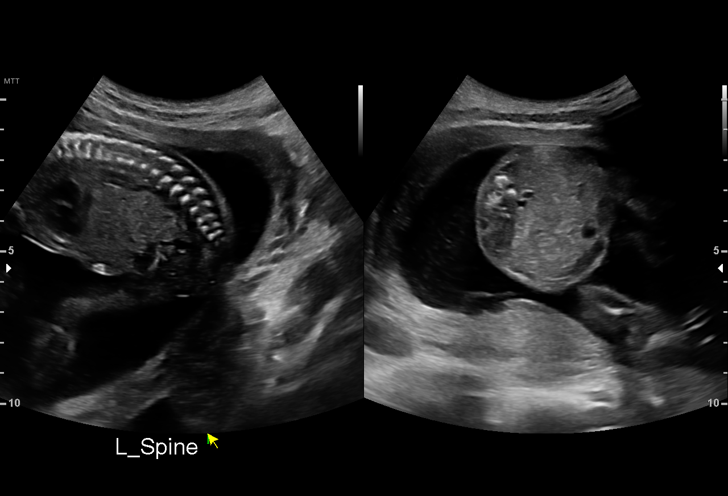
[im 66/128]
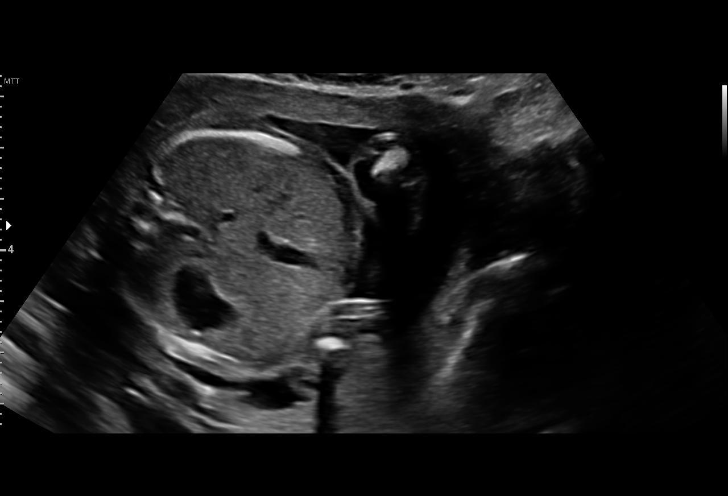
[im 76/128]
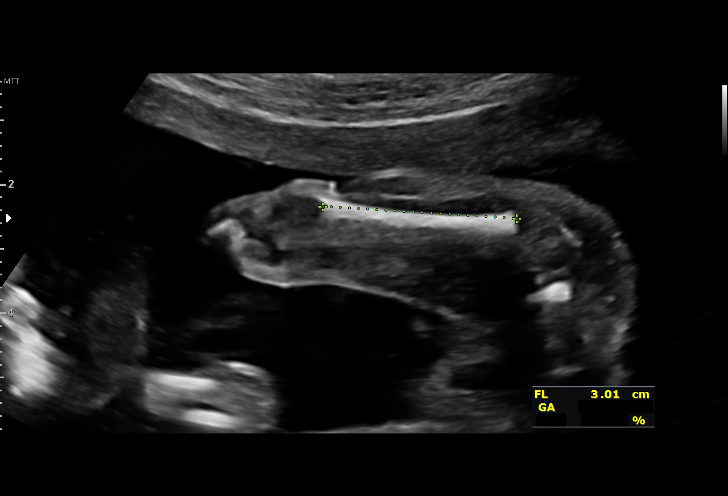
[im 85/128]
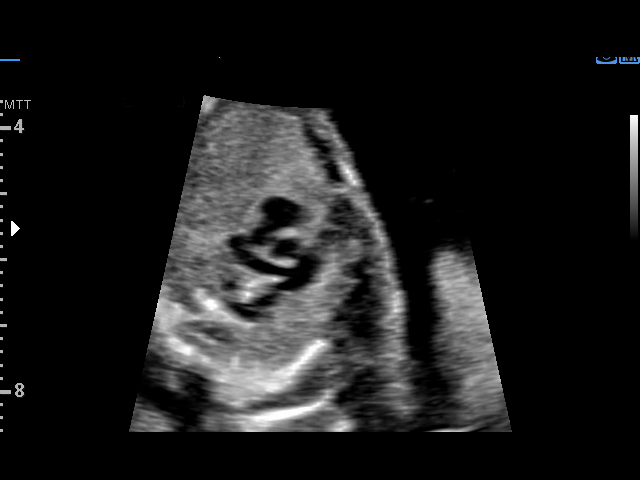
[im 95/128]
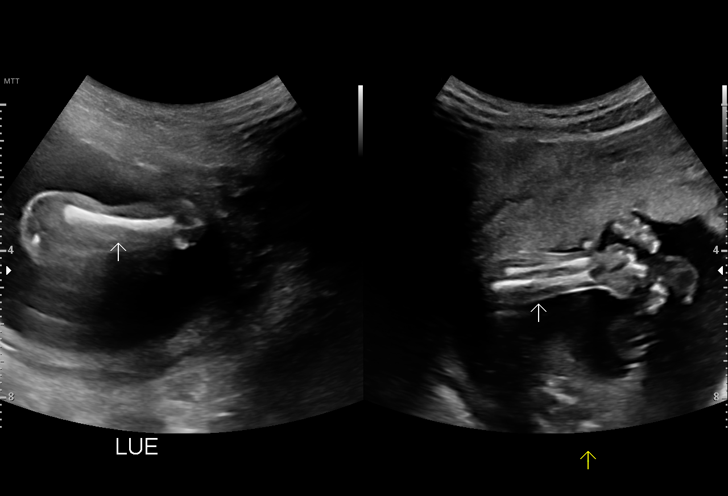
[im 104/128]
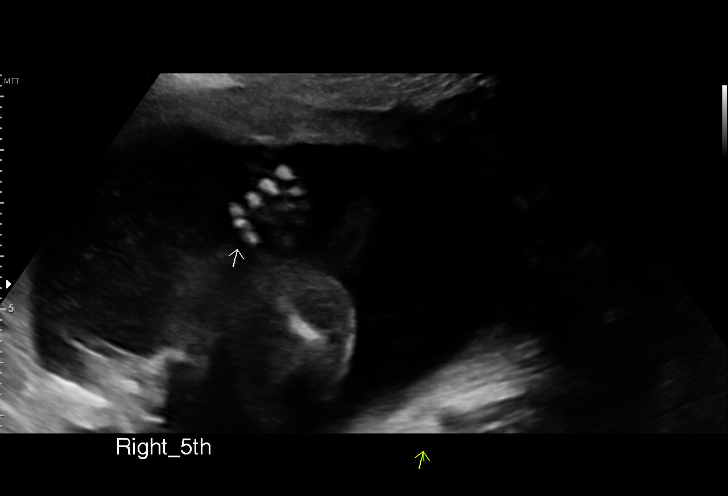
[im 113/128]
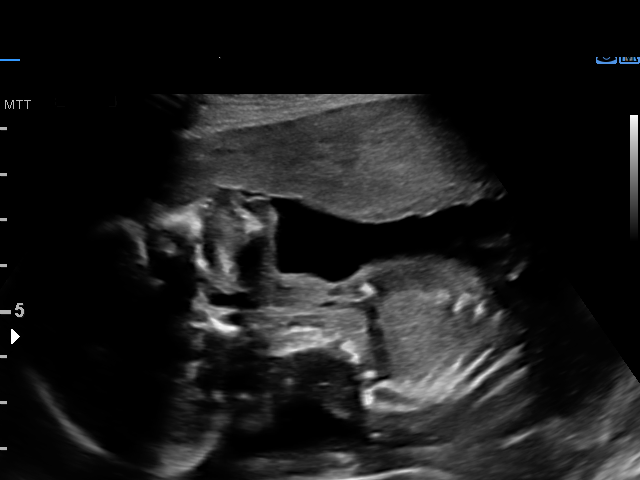
[im 123/128]
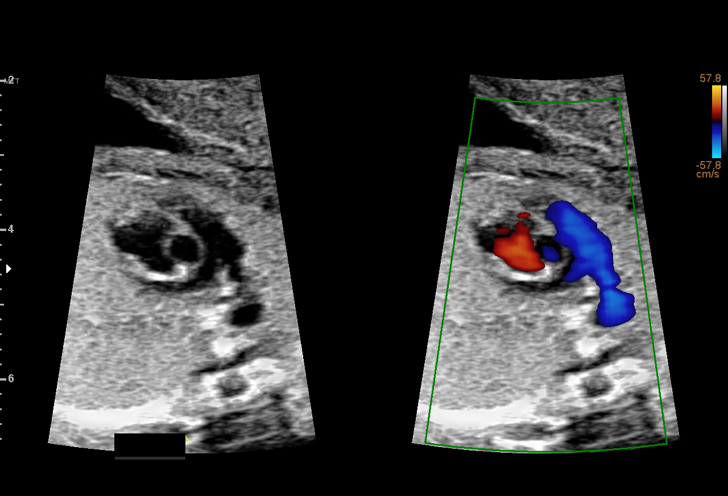

[13 of 28 positions shown; findings below may reference images not displayed]

Ref. Address:     Faculty

Indications

 Medical complication of pregnancy (Covid 19    [FX]
 + [DATE], Elevated LFTs)
 19 weeks gestation of pregnancy
 Antenatal screening for malformations          [FX]
 Asthma                                         [FX] [FX]
 LR NIPS
Fetal Evaluation

 Num Of Fetuses:         1
 Fetal Heart Rate(bpm):  147
 Cardiac Activity:       Observed
 Presentation:           Breech
 Placenta:               Right lateral
 P. Cord Insertion:      Visualized, central

 Amniotic Fluid
 AFI FV:      Within normal limits

                             Largest Pocket(cm)

Biometry

 BPD:      48.7  mm     G. Age:  20w 5d         83  %    CI:        80.28   %    70 - 86
                                                         FL/HC:      17.7   %    16.8 -
 HC:      171.7  mm     G. Age:  19w 5d         37  %    HC/AC:      1.10        1.09 -
 AC:      156.6  mm     G. Age:  20w 6d         76  %    FL/BPD:     62.4   %
 FL:       30.4  mm     G. Age:  19w 3d         26  %    FL/AC:      19.4   %    20 - 24
 HUM:      29.6  mm     G. Age:  19w 5d         49  %
 CER:      20.3  mm     G. Age:  19w 4d         57  %
 NFT:       3.3  mm

 LV:        7.1  mm
 CM:        6.3  mm

 Est. FW:     335  gm    0 lb 12 oz      62  %
OB History

 Blood Type:   O+
 Gravidity:    3         Term:   0        Prem:   0        SAB:   2
 TOP:          0       Ectopic:  0        Living: 0
Gestational Age

 LMP:           19w 6d        Date:  [DATE]                 EDD:   [DATE]
 U/S Today:     20w 1d                                        EDD:   [DATE]
 Best:          19w 6d     Det. By:  LMP  ([DATE])          EDD:   [DATE]
Anatomy

 Cranium:               Appears normal         LVOT:                   Appears normal
 Cavum:                 Appears normal         Aortic Arch:            Appears normal
 Ventricles:            Appears normal         Ductal Arch:            Appears normal
 Choroid Plexus:        Appears normal         Diaphragm:              Appears normal
 Cerebellum:            Appears normal         Stomach:                Appears normal, left
                                                                       sided
 Posterior Fossa:       Appears normal         Abdomen:                Appears normal
 Nuchal Fold:           Appears normal         Abdominal Wall:         Appears nml (cord
                                                                       insert, abd wall)
 Face:                  Appears normal         Cord Vessels:           Appears normal (3
                        (orbits and profile)                           vessel cord)
 Lips:                  Appears normal         Kidneys:                Appear normal
 Palate:                Appears normal         Bladder:                Appears normal
 Thoracic:              Appears normal         Spine:                  Appears normal
 Heart:                 Appears normal         Upper Extremities:      Appears normal
                        (4CH, axis, and
                        situs)
 RVOT:                  Appears normal         Lower Extremities:      Appears normal

 Other:  Fetus appears to be a male. Nasal Bone, Lenses, Heels/feet and
         open hands/5th digits visualized. Technically difficult due to fetal
         position.
Cervix Uterus Adnexa

 Cervix
 Length:           3.78  cm.
 Normal appearance by transabdominal scan.

 Uterus
 No abnormality visualized.

 Right Ovary
 Within normal limits.
 Left Ovary
 Simple cyst measuring 3.2 x 2.2 x 2.6 cm.

 Cul De Sac
 No free fluid seen.

 Adnexa
 No abnormality visualized.
Impression

 G1 P0.  Patient is here for fetal anatomy scan.  On cell free
 fetal DNA screening, the risks of fetal aneuploidies are not
 increased.
 She had COVID infection last week.  She was evaluated in
 the hospital for abdominal pain.  Liver enzymes were
 elevated.  She had MRI abdomen and pelvis.  No pelvic or
 abdominal pathology was seen.  Patient feels well now.
 We performed fetal anatomy scan. No makers of
 aneuploidies or fetal structural defects are seen. Fetal
 biometry is consistent with her LMP daing.  Amniotic fluid is
 normal and good fetal activity is seen. Patient understands
 the limitations of ultrasound in detecting fetal anomalies.
 A small left ovarian cyst is seen (measurements above).
 Based on unsure LMP date, we have amended her EDD to
 [DATE].
Recommendations

 -An appointment was made for her to return in 6 weeks for
 fetal growth assessment.
                 JHON RUSLY

## 2021-02-08 ENCOUNTER — Other Ambulatory Visit: Payer: Self-pay

## 2021-02-08 ENCOUNTER — Ambulatory Visit
Admission: EM | Admit: 2021-02-08 | Discharge: 2021-02-08 | Disposition: A | Discharge status: Home / Self Care | Payer: Medicaid Other | Attending: Emergency Medicine | Admitting: Emergency Medicine

## 2021-02-08 DIAGNOSIS — Z20822 Contact with and (suspected) exposure to covid-19: Secondary | ICD-10-CM

## 2021-02-08 NOTE — ED Triage Notes (Signed)
Pt requesting to be Covid tested, she denies symptoms.

## 2021-02-09 LAB — SARS-COV-2, NAA 2 DAY TAT

## 2021-02-09 LAB — NOVEL CORONAVIRUS, NAA: SARS-CoV-2, NAA: NOT DETECTED

## 2021-02-21 ENCOUNTER — Other Ambulatory Visit: Payer: Self-pay

## 2021-02-21 ENCOUNTER — Ambulatory Visit (INDEPENDENT_AMBULATORY_CARE_PROVIDER_SITE_OTHER): Payer: Medicaid Other | Admitting: Obstetrics and Gynecology

## 2021-02-21 ENCOUNTER — Encounter: Payer: Self-pay | Admitting: Obstetrics and Gynecology

## 2021-02-21 VITALS — BP 98/59 | HR 88 | Wt 105.0 lb

## 2021-02-21 DIAGNOSIS — Z3400 Encounter for supervision of normal first pregnancy, unspecified trimester: Secondary | ICD-10-CM

## 2021-02-21 NOTE — Progress Notes (Signed)
   PRENATAL VISIT NOTE  Subjective:  Brianna Thornton is a 20 y.o. G3P0020 at [redacted]w[redacted]d being seen today for ongoing prenatal care.  She is currently monitored for the following issues for this low-risk pregnancy and has Ingrown toenail; Seizure-like activity (HCC); Episodic tension-type headache, not intractable; and Encounter for supervision of normal pregnancy, antepartum on their problem list.  Patient reports no complaints.  Contractions: Not present. Vag. Bleeding: None.  Movement: Present. Denies leaking of fluid.   The following portions of the patient's history were reviewed and updated as appropriate: allergies, current medications, past family history, past medical history, past social history, past surgical history and problem list.   Objective:   Vitals:   02/21/21 1343  BP: (!) 98/59  Pulse: 88  Weight: 105 lb (47.6 kg)    Fetal Status: Fetal Heart Rate (bpm): 150 Fundal Height: 22 cm Movement: Present     General:  Alert, oriented and cooperative. Patient is in no acute distress.  Skin: Skin is warm and dry. No rash noted.   Cardiovascular: Normal heart rate noted  Respiratory: Normal respiratory effort, no problems with respiration noted  Abdomen: Soft, gravid, appropriate for gestational age.  Pain/Pressure: Absent     Pelvic: Cervical exam deferred        Extremities: Normal range of motion.     Mental Status: Normal mood and affect. Normal behavior. Normal judgment and thought content.   Assessment and Plan:  Pregnancy: G3P0020 at [redacted]w[redacted]d 1. Supervision of normal first pregnancy, antepartum Patient is doing well without complaints Third trimester labs and glucola next visit  Preterm labor symptoms and general obstetric precautions including but not limited to vaginal bleeding, contractions, leaking of fluid and fetal movement were reviewed in detail with the patient. Please refer to After Visit Summary for other counseling recommendations.   Return in about 4 weeks  (around 03/21/2021) for in person, ROB, Low risk, 2 hr glucola next visit.  Future Appointments  Date Time Provider Department Center  03/23/2021 12:45 PM [facility] US[facility]    [name], MD

## 2021-02-21 NOTE — Progress Notes (Signed)
Pt has been having some rib pain.

## 2021-03-17 ENCOUNTER — Other Ambulatory Visit: Payer: Self-pay | Admitting: *Deleted

## 2021-03-17 ENCOUNTER — Other Ambulatory Visit: Payer: Self-pay

## 2021-03-17 ENCOUNTER — Ambulatory Visit: Payer: Medicaid Other | Attending: Obstetrics and Gynecology

## 2021-03-17 DIAGNOSIS — O98512 Other viral diseases complicating pregnancy, second trimester: Secondary | ICD-10-CM

## 2021-03-17 DIAGNOSIS — Z3A25 25 weeks gestation of pregnancy: Secondary | ICD-10-CM | POA: Diagnosis not present

## 2021-03-17 DIAGNOSIS — Z362 Encounter for other antenatal screening follow-up: Secondary | ICD-10-CM

## 2021-03-17 DIAGNOSIS — Z3689 Encounter for other specified antenatal screening: Secondary | ICD-10-CM | POA: Insufficient documentation

## 2021-03-17 DIAGNOSIS — R748 Abnormal levels of other serum enzymes: Secondary | ICD-10-CM

## 2021-03-17 IMAGING — US US MFM OB FOLLOW-UP
1 series · 13 of 28 positions shown · non-contrast
Comparison: none

[Series 1: us mfm ob follow-up · 13 of 57 slices shown]
[im 3/57]
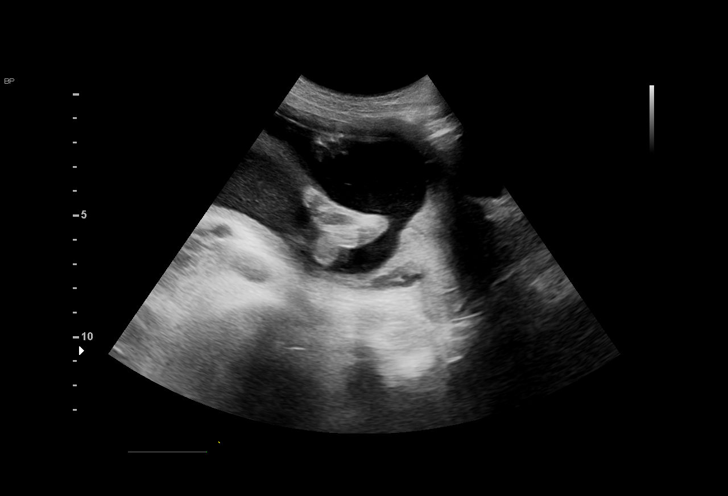
[im 7/57]
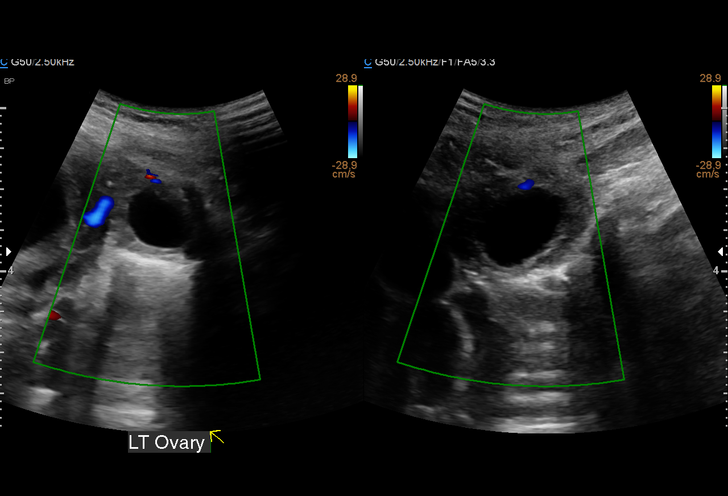
[im 11/57]
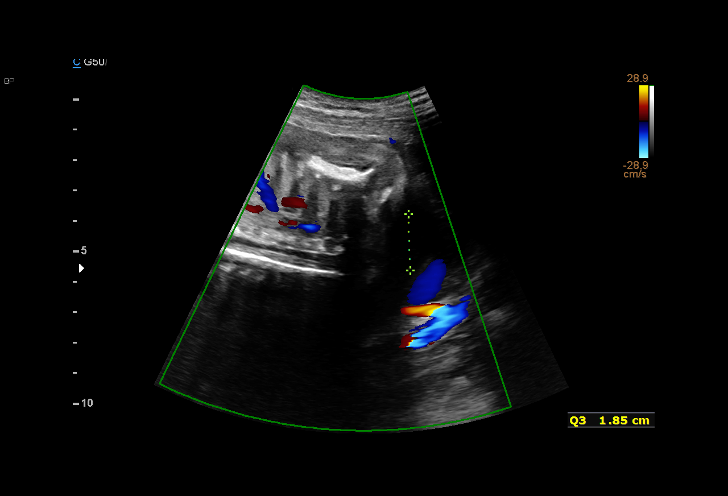
[im 15/57]
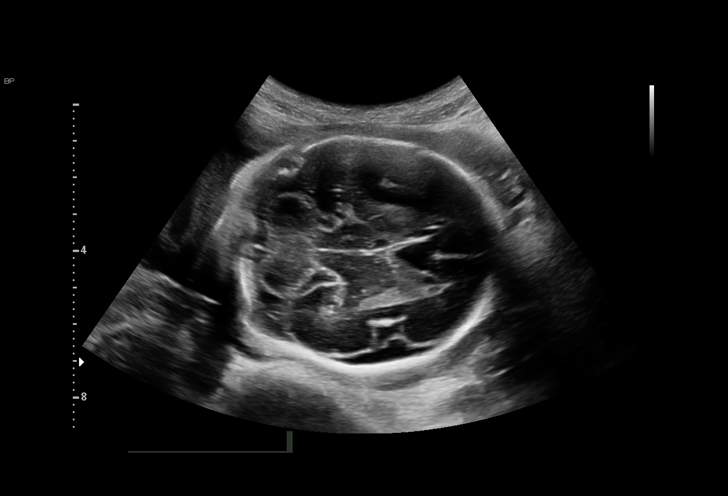
[im 19/57]
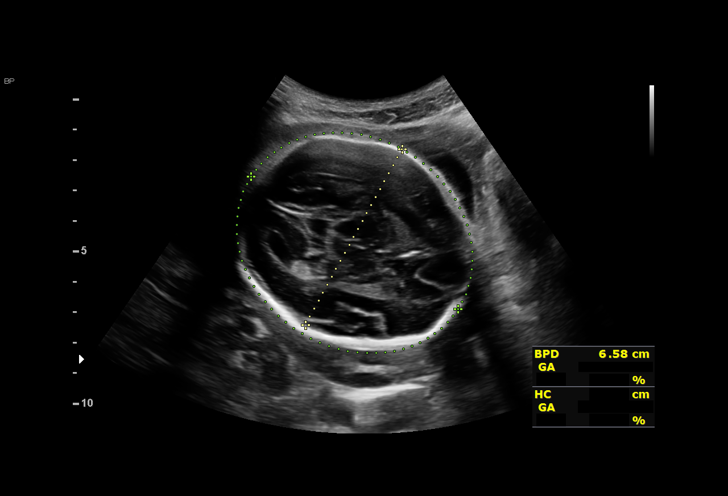
[im 23/57]
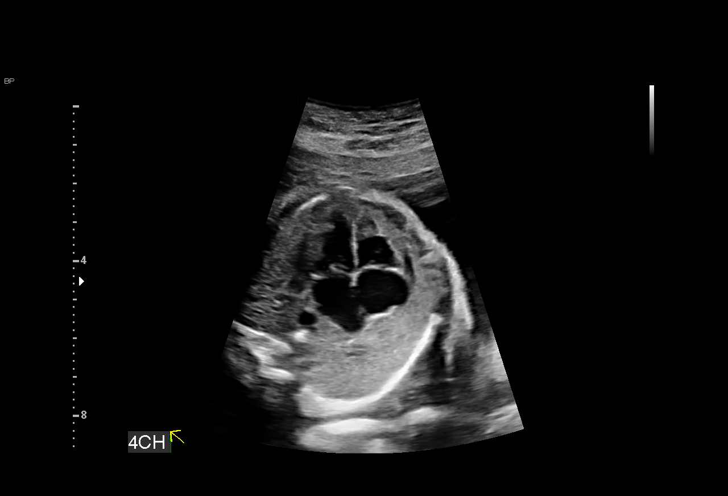
[im 30/57]
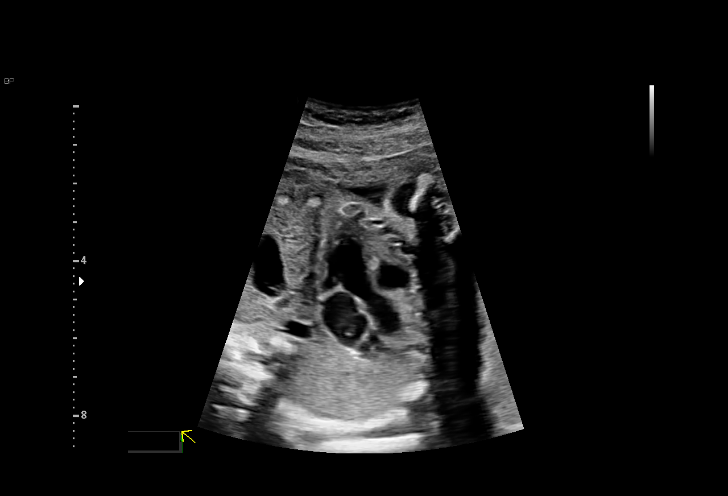
[im 34/57]
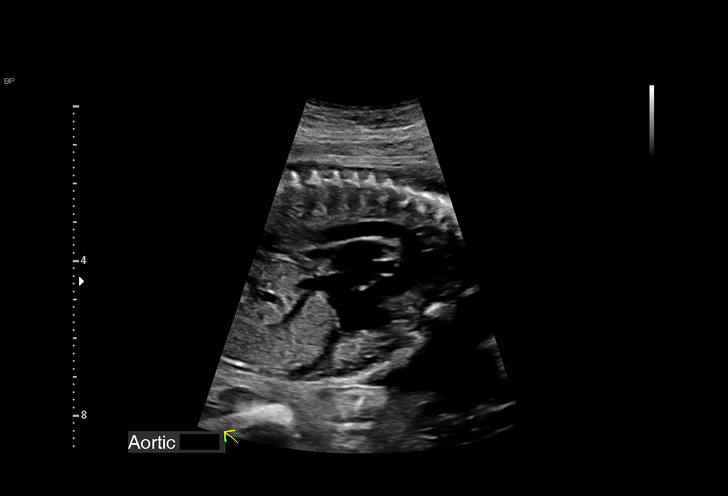
[im 38/57]
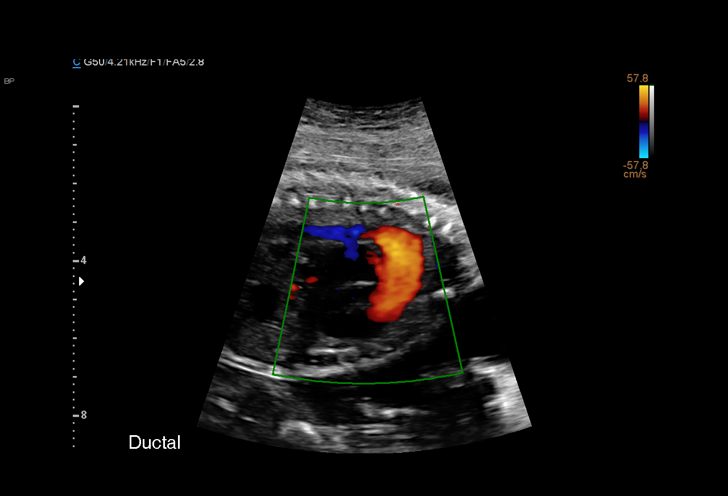
[im 42/57]
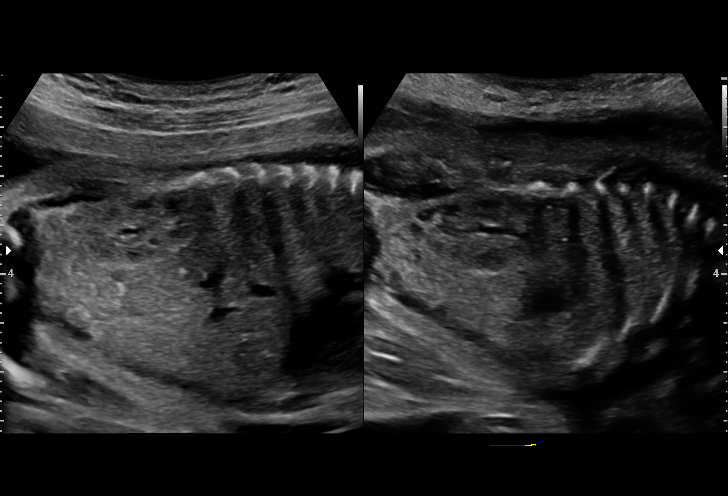
[im 46/57]
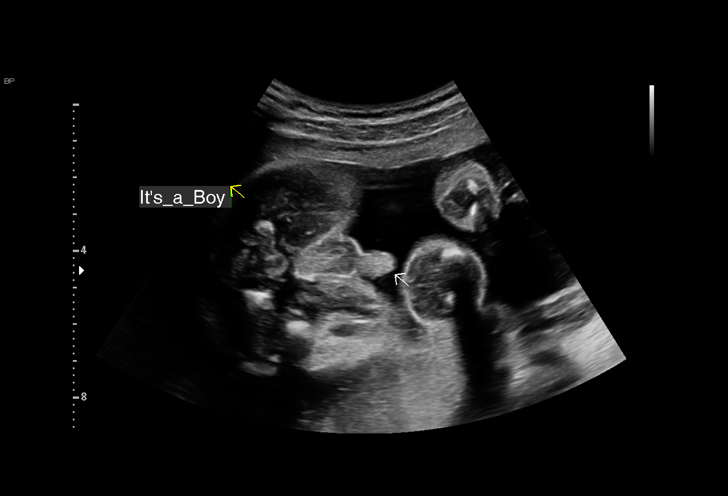
[im 50/57]
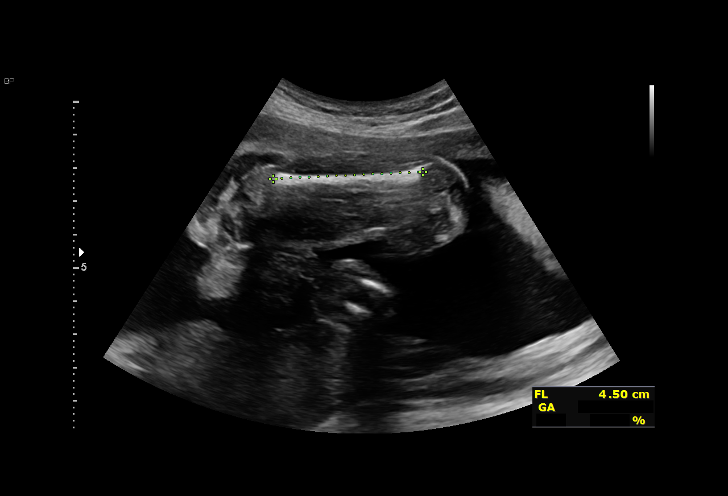
[im 54/57]
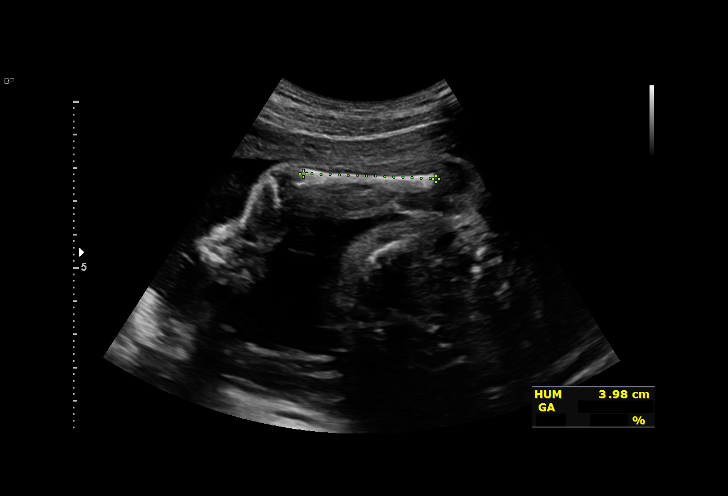

[13 of 28 positions shown; findings below may reference images not displayed]

Ref. Address:     Faculty

Indications

 Medical complication of pregnancy (Covid 19    [5A]
 + [DATE], Elevated LFTs)
 Asthma                                         [5A] [5A]
 LR NIPS
 25 weeks gestation of pregnancy
Fetal Evaluation

 Num Of Fetuses:         1
 Fetal Heart Rate(bpm):  135
 Cardiac Activity:       Observed
 Presentation:           Cephalic
 Placenta:               Right lateral
 P. Cord Insertion:      Previously Visualized

 Amniotic Fluid
 AFI FV:      Within normal limits

 AFI Sum(cm)     %Tile       Largest Pocket(cm)
 11.8            24

 RUQ(cm)       RLQ(cm)       LUQ(cm)        LLQ(cm)

Biometry
 BPD:      65.4  mm     G. Age:  26w 3d         79  %    CI:        78.67   %    70 - 86
                                                         FL/HC:      19.0   %    18.7 -
 HC:      233.2  mm     G. Age:  25w 3d         30  %    HC/AC:      1.13        1.04 -
 AC:      205.9  mm     G. Age:  25w 1d         37  %    FL/BPD:     67.9   %    71 - 87
 FL:       44.4  mm     G. Age:  24w 4d         18  %    FL/AC:      21.6   %    20 - 24
 HUM:      40.4  mm     G. Age:  24w 4d         26  %
 CER:        29  mm     G. Age:  25w 4d         57  %
 LV:        3.8  mm
 CM:        3.6  mm
 Est. FW:     765  gm    1 lb 11 oz      30  %
OB History

 Blood Type:   O+
 Gravidity:    3         Term:   0        Prem:   0        SAB:   2
 TOP:          0       Ectopic:  0        Living: 0
Gestational Age

 LMP:           25w 2d        Date:  [DATE]                 EDD:   [DATE]
 U/S Today:     25w 3d                                        EDD:   [DATE]
 Best:          25w 2d     Det. By:  LMP  ([DATE])          EDD:   [DATE]
Anatomy

 Cranium:               Appears normal         LVOT:                   Appears normal
 Cavum:                 Appears normal         Aortic Arch:            Appears normal
 Ventricles:            Appears normal         Ductal Arch:            Appears normal
 Choroid Plexus:        Appears normal         Diaphragm:              Appears normal
 Cerebellum:            Appears normal         Stomach:                Appears normal, left
                                                                       sided
 Posterior Fossa:       Appears normal         Abdomen:                Appears normal
 Nuchal Fold:           Not applicable (>20    Abdominal Wall:         Previously seen
                        wks GA)
 Face:                  Profile nl; orbits     Cord Vessels:           Appears normal (3
                        prev well visualized                           vessel cord)
 Lips:                  Appears normal         Kidneys:                Appear normal
 Palate:                Previously seen        Bladder:                Appears normal
 Thoracic:              Appears normal         Spine:                  Previously seen
 Heart:                 Appears normal         Upper Extremities:      Previously seen
                        (4CH, axis, and
                        situs)
 RVOT:                  Appears normal         Lower Extremities:      Previously seen

 Other:  Fetus appears to be a male. VC, 3VV and 3VTV visualized. Nasal
         Bone, Lenses, Heels/feet and open hands/5th digits previously
         visualized. Technically difficult due to fetal position.
Cervix Uterus Adnexa

 Cervix
 Not visualized (advanced GA >[5A])

 Uterus
 Normal shape and size.
 Right Ovary
 Within normal limits.

 Left Ovary
 Within normal limits. Cyst smaller now 1.4 x 2.2 x 1.5 cm

 Cul De Sac
 No free fluid seen.

 Adnexa
 No adnexal mass visualized.
Impression

 Patient had increased liver enzymes.  Abdominal MRI did not
 show any pathology.  She had COVID infection in this
 pregnancy.

 Patient does not have symptoms of abdominal pain.

 Amniotic fluid is normal and good fetal activity is seen .Fetal
 growth is appropriate for gestational age .  Left ovarian cyst is
 seen again but appears smaller.

 We reassured the patient of the findings.
Recommendations

 -An appointment was made for her to return in 7 weeks for
 fetal growth assessment.
                 DSILVA

## 2021-03-23 ENCOUNTER — Ambulatory Visit (INDEPENDENT_AMBULATORY_CARE_PROVIDER_SITE_OTHER): Payer: Medicaid Other | Admitting: Licensed Clinical Social Worker

## 2021-03-23 ENCOUNTER — Ambulatory Visit: Payer: Medicaid Other

## 2021-03-23 ENCOUNTER — Other Ambulatory Visit: Payer: Self-pay

## 2021-03-23 ENCOUNTER — Ambulatory Visit (INDEPENDENT_AMBULATORY_CARE_PROVIDER_SITE_OTHER): Payer: Medicaid Other | Admitting: Medical

## 2021-03-23 ENCOUNTER — Other Ambulatory Visit: Payer: Medicaid Other

## 2021-03-23 ENCOUNTER — Encounter: Payer: Self-pay | Admitting: Medical

## 2021-03-23 VITALS — BP 109/66 | HR 92 | Wt 114.0 lb

## 2021-03-23 DIAGNOSIS — Z659 Problem related to unspecified psychosocial circumstances: Secondary | ICD-10-CM | POA: Diagnosis not present

## 2021-03-23 DIAGNOSIS — Z3400 Encounter for supervision of normal first pregnancy, unspecified trimester: Secondary | ICD-10-CM

## 2021-03-23 DIAGNOSIS — F4321 Adjustment disorder with depressed mood: Secondary | ICD-10-CM | POA: Diagnosis not present

## 2021-03-23 DIAGNOSIS — R748 Abnormal levels of other serum enzymes: Secondary | ICD-10-CM | POA: Insufficient documentation

## 2021-03-23 DIAGNOSIS — Z3A26 26 weeks gestation of pregnancy: Secondary | ICD-10-CM

## 2021-03-23 NOTE — Patient Instructions (Signed)
AREA PEDIATRIC/FAMILY PRACTICE PHYSICIANS  [place] ([address]) [facility] Chambliss, MD; Eniola, MD; Hale, MD; Hensel, MD; McDiarmid, MD; McIntyer, MD; Neal, MD; Walden, MD [address] [place], [place] [address] [phone] Mon-Fri 8:30-12:30, 1:30-5:00 Providers come to see babies at [facility] Accepting Medicaid [facility] providers who accept newborns: Koirala, MD; Morrow, MD; Wolters, MD [address], [place], [place] [address] [phone] Mon-Fri 8:00-5:30 Babies seen by providers at [facility] Does NOT accept Medicaid Please call early in hospitalization for appointment (limited availability)  [facility] Mulberry, MD [address] [place], [place] [address] [phone] Mon, Tue, Thur, Fri 8:30-5:00, Wed 10:00-7:00 (closed 1-2pm) Babies seen by [facility] providers Accepting Medicaid Rubin - Pediatrician Rubin, MD [address], [place], [place] [address] [phone] Mon-Fri 8:30-5:00, Sat 8:30-12:00 Provider comes to see babies at [facility] Accepting Medicaid Must have been referred from current patients or contacted office prior to delivery [facility] ([facility]) Brown, MD; Chandler, MD; Ettefagh, MD; Grant, MD; Lester, MD; McCormick, MD; McQueen, MD; Prose, MD; Simha, MD; Stanley, MD; Stryffeler, NP; Tebben, NP [address], [place], [place] [address] [phone] Mon, Tue, Thur, Fri 8:30-5:30, Wed 9:30-5:30, Sat 8:30-12:30 Babies seen by [facility] providers Accepting Medicaid Only accepting infants of first-time parents or siblings of current patients Hospital discharge coordinator will make follow-up appointment Jack Amos [address], [place], [place]  [address] [phone]   Fax - [phone] [facility] [address], [place], [place]  [address] Phone - [phone]   Fax - [phone] Shilpa Gosrani [address], [place], [place]  [address] [phone]  [place] ([address]) [facility] Bates, MD; Brassfield, MD; Cooper, Cox, MD; MD; Davis, MD; Dovico, MD; Ettefaugh, MD; Little, MD; Lowe, MD; Keiffer, MD; Melvin, MD; Sumner, MD; Williams, MD [address], [place], [place] [address] [phone] Mon-Fri 8:30-5:00 (extended evenings Mon-Thur as needed), Sat-Sun 10:00-1:00 Providers come to see babies at [facility] Accepting Medicaid for families of first-time babies and families with all children in the household age 3 and under. Must register with office prior to making appointment (M-F only). [facility] Henson, NP; Knapp, MD; Lalonde, MD; Tysinger, PA [address] [place], [place] [address] [phone] Mon-Fri 8:00-5:00 Babies seen by providers at [facility] Does NOT accept Medicaid/Commercial Insurance Only [facility] ([facility])  Artis, MD; Barnes, MD; Bratton, MD; Coccaro, MD; Lockett Gardner, MD; Kramer, MD; Marshall, MD; Netherton, MD; Poleto, MD; Skinner, MD [address] [place], [place] [address] [phone] Mon-Fri 8:30-5:30, Sat (Oct.-Mar.) 9:00-1:00 Babies seen by providers at [facility] Accepting Medicaid  [place] ([address]) [facility] Reid, MD; Warner, MD [address], [place],  [address] [phone] Mon-Fri 8:30-5:00, Sat 8:30-12:00 Providers come to see babies at [facility] Does NOT accept Medicaid [facility] Becker, PA; Hagler, MD; Scifres, PA; Sun, MD; Swayne, MD [address], [place],  [address] [phone] Mon-Fri 8:00-5:00 Babies seen by providers at [facility] Does NOT accept Medicaid Only accepting babies of parents who  are patients Please call early in hospitalization for appointment (limited availability)  Pediatricians Clark, MD; Frye, MD; Kelleher, MD; Mack, NP; Miller, MD; O'Keller, MD; Patterson, NP; Pudlo, MD; Puzio, MD; Thomas, MD; Tucker, MD; Twiselton, MD [address], [place], [place] [address] [phone] Mon-Fri 8:00-5:00, Sat 9:00-12:00 Providers come to see babies at [facility] Does NOT accept Medicaid  [place] ([address]) [facility] providers accepting new patients: Brake, NP; Wharton, PA [address], [place], [place] [address] [phone] Mon-Fri 8:00-5:00 Babies seen by providers at [facility] Does NOT accept Medicaid Only accepting babies of parents who are patients Please call early in hospitalization for appointment (limited availability) [facility] Gay, MD; Quinlan, MD [address] [place], [place] [address] [phone] (press [phone] to schedule appointment) Mon-Fri 8:00-5:00 Providers come to see babies at [facility] Does NOT accept Medicaid [facility] Mazer, MD [address] [place], [place] [address] [phone] Mon-Fri 8:30-5:00 (lunch 12:30-1:00), extended hours by appointment only Wed 5:00-6:30 Babies seen by [facility] providers Accepting Medicaid [facility] Banks, MD; Jordan, MD; Koberlein, MD [address], [place], [place] [address] [phone] Mon-Fri 8:00-5:00 Babies seen by [facility] providers Does NOT accept Medicaid [facility] Parker, MD; Hunter, MD; Wallace, DO [address] [place], [place] [address] [phone] Mon-Fri 8:00-5:00 Babies seen by [facility] providers Does NOT accept Medicaid [facility] Brandon, PA; Brecken, PA; Christy, NP; Dees, MD; DeClaire, MD; DeWeese, MD; Hansen, NP; Mills, NP; Parrish, NP; Smoot, NP; Summer, MD; Vapne,  MD [address] [place], [place] [address] [phone] Mon-Fri 8:30-5:00, Sat 10:00-1:00 Providers come to see babies at [facility] Does NOT accept Medicaid Free prenatal information session Tuesdays at 4:45pm [facility] Bouska, MD; Gordon, PA; Jeffery, PA; Weber, PA [address] [place] [address] [phone] Mon-Fri 7:30-5:30 Babies seen by [facility] providers [facility] [address], [place], [place]  [address] [phone]   Fax - [phone]  [place] ([address] & [address]) [facility] [place] [address] [place], [place] [address] [phone] Mon-Thur 8:00-6:00 Providers come to see babies at [facility] Accepting Medicaid [facility] Anderson, NP; Badger, MD; Beal, PA; Spencer, PA [address] [place], [place] [address] [phone] Mon-Thur 7:30-7:30, Fri 7:30-4:30 Babies seen by [facility] providers Accepting Medicaid [facility] Agbuya, MD; Klett, NP; Romgoolam, MD [address], [place], [place] [address] [phone] Mon-Fri 8:30-5:00, Sat 8:30-12:00 Providers come to see babies at [facility] Accepting Medicaid Must have "Meet & Greet" appointment at office prior to delivery [facility] ([facility]) McCord, MD; Wallace, MD; Wood, MD [address], [place], [place] [address] [phone] Mon-Wed 8:00-6:00, Thur-Fri 8:00-5:00, Sat 9:00-12:00 Providers come to see babies at [facility] Does NOT accept Medicaid Only accepting siblings of current patients [facility]  [address], [place], [place]  [address] [phone]   Fax - [phone] [facility] [address], [place], [place]  [address] [phone]   Fax -  [phone]  [place] ([address] & [address]) [facility] Cirigliano, DO; Matthews, DO [address] [place], [place] [address] [phone] Mon-Fri 7:00-5:00 Babies seen by [facility] providers Does NOT accept Medicaid [facility] Briscoe, MD; Howley, PA; Moreira, PA [address], [place], [place] [address] [phone] Mon-Fri 8:00-5:00 Babies seen by [facility] providers Accepting Medicaid [facility] Boyd, MD; Church, PA; Jones, NP; Osborn, PA [address], ,  [address] [phone] Mon-Fri 8:00-5:00 Babies seen by providers at [facility] Accepting Medicaid  [place] ([address]) [facility] [place] Wendling, DO [address] [place], [place] [address] [phone] Mon-Fri 8:00-5:00 Babies seen by [facility] providers Does NOT accept Medicaid Limited availability, please call early in hospitalization to schedule follow-up [facility] Calderon, PA; Cummings, MD; Dillard, MD; Martin, PA; Olson, MD; VanDeven, PA [address], [place], [place] [address] [phone] Mon-Fri 8:30-5:00, Sat 9:00-12:00 Babies seen by providers at [facility] Accepting Medicaid Please register online then schedule online or call office [email or web address] Hunter, NP; Kumar, MD; Martin Rogers, PA [address], [place], [place] [address] [phone] Mon-Fri 8:00-5:00 Babies seen by providers at [facility] Accepting Medicaid [facility] ([facility]) [place], MD; Kristi Fleenor, NP; West, MD [address], [place], [place] [address] [phone] Mon-Fri 8:00-5:30, Sat&Sun by appointment (phones open at  8:30) Babies seen by [facility] providers Accepting Medicaid Must be a first-time baby or sibling of current patient [facility] [place]  [address], [place], [place]  [address] [phone]   Fax - [phone]  [place] ([address]) [facility] Brown, PA; Cowen, PA; Rice, MD; Helton, PA; Spry, MD [address] [place], [place] [address] [phone] Mon-Thur 8:00-7:00, Fri 8:00-5:00, Sat 8:00-12:00, Sun 9:00-12:00 Babies seen by [facility] providers Accepting Medicaid [facility] Coe-Goins, MD; Marshall, MD; Pierre-Louis, MD [address], [place], [place] [address] [phone] Mon-Thur 8:00-5:00 Babies seen by providers at [facility] Accepting Medicaid [facility] Bratton, MD; Coe-Goins, MD; Hayes, MD; Lewis, MD; List, MD; Lott, MD; Marshall, MD; Moran, MD; O'Neal, MD; Pierre-Louis, MD; Pitonzo, MD; Scholer, MD; Spangle, MD [address] [place], [place] [address] [phone] Mon-Fri 8:00-5:30, Sat (Oct.-Mar.) 9:00-1:00 Babies seen by providers at [facility] Accepting Medicaid Must fill out new patient packet, available online at [email or web address] [facility] ([facility]) Friddle, NP; Harris, NP; Kelly, NP; Logan, MD; Melvin, PA; Poth, MD; Ramadoss, MD; Stanton, NP [address], [place], [place] [address] [phone] Mon-Thur 8:00-5:30, Fri 8:00-5:00 Babies seen by providers at [facility] Accepting Medicaid  [place] ([address]) [facility] Dixon, PA; [place], MD; Pickard, MD; Tapia, PA [address], [place], [place] [address] [phone] Mon-Fri 8:00-5:00 Babies seen by providers at [facility] Accepting Medicaid   [place] ([address]) [facility] [place] Masneri, DO; Meyers, MD; Nelson, PA [address], [place], [place] [address] [phone] Mon-Fri 8:00-5:00 Babies seen by providers at [facility] Does NOT accept Medicaid Limited appointment availability, please call early in hospitalization  [facility] [place] Kunedd, DO; McGowen, MD [address], [place], [place] [address] [phone] Mon-Fri 8:00-5:00 Babies seen by [facility] providers Does NOT accept Medicaid [facility] [place] Cameron, MD; MacDonald, MD; Michaels, PA; Nayak, MD [address] [place] [address], [place], [place] [address] [phone] Mon-Fri 8:00-5:00 After hours clinic ([address] [place], [place] [address]) [phone] Mon-Fri 5:00-8:00, Sat 12:00-6:00, Sun 10:00-4:00 Babies seen by [facility] providers Accepting Medicaid [facility] [place] [address], [place], [place]  [address] [phone]   Fax - [phone]  [place] ([address]) [facility] [place] [facility] Andy, MD [address], [place], [place] [address] [phone] Mon-Fri 8:00-5:00 Babies seen by [facility] providers Does NOT accept Medicaid [facility] [place] ([facility] [place]) Eksir, MD [address], [place], [place] [address] [phone] Mon-Thur 8:00-7:00, Fri 8:00-5:00, Sat 8:00-12:00 Babies seen by providers at [facility] Accepting Medicaid - but does not have vaccinations in office (must be received elsewhere) Limited availability, please call early in hospitalization  [place] ([address]) [facility]  Charlene Flemming, MD [address], [place] [address] [phone]  Fax [phone]  [facility]  Kimberly Newton, MD, Annamarie Streilein, PA, Carla Hampton, PA [address] [place], [place]  [address] [phone] [facility]  [address], [place], [place] [address] [phone] [address], [place], [place] [address] [phone] (West Office)  [facility] [address], [place], [place] [address] [phone] [facility] [address], [place], [place] [address] [phone] [facility] [address] [place] [address], [place], [place] [address] [phone] [facility] [address], [place], [place] [address] [phone] [facility] [address], [place], [place] [address] [phone] [facility] [address], [place], [place] [address] [phone] [facility]  [address], [place], [place] [address] [phone] Dr[facility] [address] [place] [address], [place], [place] [address] [phone] [facility] [address], [place], [place] [address] [phone] [facility] [address], ,  [address] [phone]  

## 2021-03-23 NOTE — Progress Notes (Signed)
° °  PRENATAL VISIT NOTE  Subjective:  Brianna Thornton is a 20 y.o. G3P0020 at [redacted]w[redacted]d being seen today for ongoing prenatal care.  She is currently monitored for the following issues for this high-risk pregnancy and has Ingrown toenail; Seizure-like activity (HCC); Episodic tension-type headache, not intractable; Encounter for supervision of normal pregnancy, antepartum; and Elevated liver enzymes on their problem list.  Patient reports occasional diffuse abdominal pain.  Contractions: Not present. Vag. Bleeding: None.  Movement: Present. Denies leaking of fluid.   The following portions of the patient's history were reviewed and updated as appropriate: allergies, current medications, past family history, past medical history, past social history, past surgical history and problem list.   Objective:   Vitals:   03/23/21 0919  BP: 109/66  Pulse: 92  Weight: 114 lb (51.7 kg)    Fetal Status: Fetal Heart Rate (bpm): 150 Fundal Height: 27 cm Movement: Present     General:  Alert, oriented and cooperative. Patient is in no acute distress.  Skin: Skin is warm and dry. No rash noted.   Cardiovascular: Normal heart rate noted  Respiratory: Normal respiratory effort, no problems with respiration noted  Abdomen: Soft, gravid, appropriate for gestational age.  Pain/Pressure: Absent     Pelvic: Cervical exam deferred        Extremities: Normal range of motion.     Mental Status: Normal mood and affect. Normal behavior. Normal judgment and thought content.   Assessment and Plan:  Pregnancy: G3P0020 at [redacted]w[redacted]d 1. Supervision of normal first pregnancy, antepartum - Glucose Tolerance, 2 Hours w/1 Hour - CBC - HIV Antibody (routine testing w rflx) - RPR - Declined TDAP today, will consider for next visit  - Peds list given  - Growth [place] scheduled 05/05/21  2. Elevated liver enzymes - In MAU 11/9 when diagnosed with COVID, recheck today  - Comprehensive metabolic panel  3. [redacted] weeks gestation of  pregnancy  Preterm labor symptoms and general obstetric precautions including but not limited to vaginal bleeding, contractions, leaking of fluid and fetal movement were reviewed in detail with the patient. Please refer to After Visit Summary for other counseling recommendations.   Return in about 2 weeks (around 04/06/2021) for LOB, In-Person, any provider.  Future Appointments  Date Time Provider Department Center  03/23/2021 10:00 AM Gwyndolyn [place], [place] [facility] None  05/05/2021 10:30 AM [facility] NURSE [facility] [place]  05/05/2021 10:45 AM [facility] US4 [facility]    Vonzella Nipple, PA-C

## 2021-03-23 NOTE — Progress Notes (Signed)
Pt states mfm recommends repeat labs for liver function, was abnormal last check. Pt does have some occ epigastric pain, ?indegestion.   PHQ9 score 5 GAD score 3

## 2021-03-24 LAB — COMPREHENSIVE METABOLIC PANEL
ALT: 6 IU/L (ref 0–32)
AST: 14 IU/L (ref 0–40)
Albumin/Globulin Ratio: 2 (ref 1.2–2.2)
Albumin: 4 g/dL (ref 3.9–5.0)
Alkaline Phosphatase: 78 IU/L (ref 42–106)
BUN/Creatinine Ratio: 17 (ref 9–23)
BUN: 7 mg/dL (ref 6–20)
Bilirubin Total: <0.2 mg/dL (ref 0.0–1.2)
CO2: 20 mmol/L (ref 20–29)
Calcium: 8.7 mg/dL (ref 8.7–10.2)
Chloride: 106 mmol/L (ref 96–106)
Creatinine, Ser: 0.42 mg/dL — ABNORMAL LOW (ref 0.57–1.00)
Globulin, Total: 2 g/dL (ref 1.5–4.5)
Glucose: 76 mg/dL (ref 70–99)
Potassium: 4.2 mmol/L (ref 3.5–5.2)
Sodium: 141 mmol/L (ref 134–144)
Total Protein: 6 g/dL (ref 6.0–8.5)
eGFR: 144 mL/min/{1.73_m2} (ref >59)

## 2021-03-24 LAB — HIV ANTIBODY (ROUTINE TESTING W REFLEX): HIV Screen 4th Generation wRfx: NONREACTIVE

## 2021-03-24 LAB — RPR: RPR Ser Ql: NONREACTIVE

## 2021-03-24 LAB — CBC
Hematocrit: 34.1 % (ref 34.0–46.6)
Hemoglobin: 11.4 g/dL (ref 11.1–15.9)
MCH: 29.8 pg (ref 26.6–33.0)
MCHC: 33.4 g/dL (ref 31.5–35.7)
MCV: 89 fL (ref 79–97)
Platelets: 306 10*3/uL (ref 150–450)
RBC: 3.83 x10E6/uL (ref 3.77–5.28)
RDW: 12.9 % (ref 11.7–15.4)
WBC: 11.6 10*3/uL — ABNORMAL HIGH (ref 3.4–10.8)

## 2021-03-24 LAB — GLUCOSE TOLERANCE, 2 HOURS W/ 1HR
Glucose, 1 hour: 130 mg/dL (ref 70–179)
Glucose, 2 hour: 100 mg/dL (ref 70–152)
Glucose, Fasting: 77 mg/dL (ref 70–91)

## 2021-03-24 NOTE — BH Specialist Note (Signed)
Integrated Behavioral Health Initial In-Person Visit  MRN: [id number] Name: Brianna Thornton  Number of Integrated Behavioral Health Clinician visits:: 1/6 Session Start time: 10:00am  Session End time: 10:44am Total time: 44 minutes in person at [facility]   Types of Service: Individual psychotherapy  Interpretor:No. Interpretor Name and Language: None    Warm Hand Off Completed.        Subjective: Brianna Thornton is a 20 y.o. female accompanied by n/a Patient was referred by Chana Bode MD for social stressor. Patient reports the following symptoms/concerns: depressed mood, crying, limited social support, unplanned pregnancy, and anxiety Duration of problem: approx 8 months ; Severity of problem: moderate  Objective: Mood: Depressed and Affect: Depressed Risk of harm to self or others: No plan to harm self or others  Life Context: Family and Social: Lives with father of baby School/Work: n/a Self-Care: n/a Life Changes: Unplanned pregnancy  Patient and/or Family's Strengths/Protective Factors: Concrete supports in place (healthy food, safe environments, etc.)  Goals Addressed: Patient will: Reduce symptoms of: depression Increase knowledge and/or ability of: coping skills  Demonstrate ability to: Increase adequate support systems for patient/family  Progress towards Goals: Ongoing  Interventions: Interventions utilized: Supportive Counseling  Standardized Assessments completed: PHQ 9  Assessment: Patient currently experiencing adjustment disorder with depressed mood..   Patient may benefit from integrated behavioral health.  Plan: Follow up with behavioral health clinician on : 04/10/2021  Behavioral recommendations: Create smart goals and boundaries to alleviate stress. Prioritize rest and communicate needs to trusted individual, engage in activities of interest to create self care techniques Referral(s): Integrated [employer] (In Clinic) "From scale of  1-10, how likely are you to follow plan?":   Gwyndolyn Saxon, LCSW

## 2021-03-26 NOTE — L&D Delivery Note (Addendum)
OB/GYN Faculty Practice Delivery Note ? ?Brianna Thornton is a 21 y.o. G3P0020 s/p SVD at [redacted]w[redacted]d. She was admitted for IOL post term.  ? ?ROM: 16h 2m with clear fluid ?GBS Status: Negative  ?Maximum Maternal Temperature: 98.7 ? ? ?Delivery Date/Time: 07/06/2021 at 05:20 ?Delivery: Called to room and patient was complete and pushing. Head delivered . No nuchal cord present. Shoulder dystocia present with compound position. Shoulder and body were eventual delivered using McRoberts maneuver and suprapubic pressure  with posterior shoulder delivered first.  Cord clamped x 2  and cut by midwife for immediate resuscitation. Infant was taken under the light and code blue called for non-reassuring APGAR score.He required suctioning and PPV. Responded well to stimuli and resuscitation with spontaneous cry thereafter. Cord blood drawn and placenta delivered spontaneously with gentle cord traction. Fundus firm with massage and Pitocin. Labia, perineum, vagina, and cervix were inspected, showing Midline second degree perineum laceration repaired with stitches.  ? ?Placenta: complete, three vessel cord appreciated ?Complications: Shoulder dystocia, ?Lacerations: second degree perineum midline laceration  ?EBL: 150 mL ?Analgesia: Epidural ? ?Infant: Boy  APGARs 4, 7 and 9  ? ?Alen Bleacher, MD ?[facility] [place]  ? ?Attestation: ? ?I confirm that I have verified the information documented in the resident?s note and that I have also personally performed the physical exam and all medical decision making activities.  ? ?I was gloved and present for entire delivery ?SVD of head without incident ?Anterior shoulder impacted under symphysis.   I attempted posterior shoulder, was able to grasp axilla, and tried to sweep arm unsuccessfully.  In the meantime Dr Elonda Husky was called and arrived swiftly to deliver posterior arm and rest of baby.  NICU present.   Cord gas sent ? ?Lacerations as listed above ?Repair  of same done by me ?Seabron Spates, CNM ? ? ? ? ? ?  ?

## 2021-04-10 ENCOUNTER — Other Ambulatory Visit: Payer: Self-pay

## 2021-04-10 ENCOUNTER — Ambulatory Visit (INDEPENDENT_AMBULATORY_CARE_PROVIDER_SITE_OTHER): Payer: Medicaid Other | Admitting: Obstetrics and Gynecology

## 2021-04-10 ENCOUNTER — Ambulatory Visit (INDEPENDENT_AMBULATORY_CARE_PROVIDER_SITE_OTHER): Payer: Medicaid Other | Admitting: Licensed Clinical Social Worker

## 2021-04-10 ENCOUNTER — Encounter: Payer: Self-pay | Admitting: Obstetrics and Gynecology

## 2021-04-10 VITALS — BP 119/71 | HR 103 | Wt 122.0 lb

## 2021-04-10 DIAGNOSIS — Z3400 Encounter for supervision of normal first pregnancy, unspecified trimester: Secondary | ICD-10-CM

## 2021-04-10 DIAGNOSIS — F4321 Adjustment disorder with depressed mood: Secondary | ICD-10-CM | POA: Diagnosis not present

## 2021-04-10 DIAGNOSIS — Z8616 Personal history of COVID-19: Secondary | ICD-10-CM | POA: Insufficient documentation

## 2021-04-10 NOTE — Progress Notes (Signed)
Subjective:  Brianna Thornton is a 21 y.o. G3P0020 at [redacted]w[redacted]d being seen today for ongoing prenatal care.  She is currently monitored for the following issues for this low-risk pregnancy and has Seizure-like activity ([place]); Episodic tension-type headache, not intractable; Encounter for supervision of normal pregnancy, antepartum; and History of COVID-19 on their problem list.  Patient reports no complaints.  Contractions: Not present. Vag. Bleeding: None.  Movement: Present. Denies leaking of fluid.   The following portions of the patient's history were reviewed and updated as appropriate: allergies, current medications, past family history, past medical history, past social history, past surgical history and problem list. Problem list updated.  Objective:   Vitals:   04/10/21 1336  BP: 119/71  Pulse: (!) 103  Weight: 122 lb (55.3 kg)    Fetal Status: Fetal Heart Rate (bpm): 140   Movement: Present     General:  Alert, oriented and cooperative. Patient is in no acute distress.  Skin: Skin is warm and dry. No rash noted.   Cardiovascular: Normal heart rate noted  Respiratory: Normal respiratory effort, no problems with respiration noted  Abdomen: Soft, gravid, appropriate for gestational age. Pain/Pressure: Absent     Pelvic:  Cervical exam deferred        Extremities: Normal range of motion.     Mental Status: Normal mood and affect. Normal behavior. Normal judgment and thought content.   Urinalysis:      Assessment and Plan:  Pregnancy: G3P0020 at [redacted]w[redacted]d  1. Supervision of normal first pregnancy, antepartum Stable Declines Tdap  2. History of COVID-19 Growth scan next month  Preterm labor symptoms and general obstetric precautions including but not limited to vaginal bleeding, contractions, leaking of fluid and fetal movement were reviewed in detail with the patient. Please refer to After Visit Summary for other counseling recommendations.  Return in about 2 weeks (around 04/24/2021)  for OB visit, face to face, any provider.   Chancy Milroy, MD

## 2021-04-10 NOTE — Patient Instructions (Signed)

## 2021-04-11 NOTE — BH Specialist Note (Signed)
Integrated Behavioral Health Follow Up In-Person Visit  MRN: [id number] Name: Brianna Thornton  Number of Integrated Behavioral Health Clinician visits: 2/6 Session Start time: 2:03pm  Session End time: 2:35pm Total time: 32 minutes in person at [facility]   Types of Service: Individual psychotherapy  Interpretor:No. Interpretor Name and Language: none  Subjective: Mishayla Sliwinski is a 21 y.o. female accompanied by Partner/Significant Other and Father of child Patient was referred by Chana Bode MD for social stressor. Patient reports the following symptoms/concerns: depressed mood and limited social support  Duration of problem: approx 8 months  ; Severity of problem: mild  Objective: Mood: good  and Affect: Appropriate Risk of harm to self or others: No plan to harm self or others  Life Context: Family and Social: Lives with family School/Work: works full time  Self-Care: n/a Life Changes: New pregnancy  Patient and/or Family's Strengths/Protective Factors: Concrete supports in place (healthy food, safe environments, etc.)  Goals Addressed: Patient will:  Reduce symptoms of: anxiety and stress   Increase knowledge and/or ability of: coping skills and healthy habits   Demonstrate ability to: Increase healthy adjustment to current life circumstances  Progress towards Goals: Ongoing  Interventions: Interventions utilized:  Supportive Counseling Standardized Assessments completed: PHQ 9  Patient and/or Family Response: Ms. Yeaman responded good to scheduled appt.   Assessment: Patient currently experiencing adjustment disorder with depressed mood.   Patient may benefit from integrated behavioral health.  Plan: Follow up with behavioral health clinician on : 3-4 weeks  Behavioral recommendations: Prioritize rest, engage in parenting class and support groups to reduce social isolation, communicate needs for added support, and intentionally schedule time for self care  Referral(s):  Integrated [employer] (In Clinic) "From scale of 1-10, how likely are you to follow plan?":   Gwyndolyn Saxon, LCSW

## 2021-04-19 ENCOUNTER — Other Ambulatory Visit: Payer: Self-pay

## 2021-04-19 ENCOUNTER — Inpatient Hospital Stay (HOSPITAL_COMMUNITY)
Admission: AD | Admit: 2021-04-19 | Discharge: 2021-04-19 | Disposition: A | Discharge status: Home / Self Care | Payer: Medicaid Other | Attending: Obstetrics and Gynecology | Admitting: Obstetrics and Gynecology

## 2021-04-19 ENCOUNTER — Encounter (HOSPITAL_COMMUNITY): Payer: Self-pay | Admitting: Obstetrics and Gynecology

## 2021-04-19 DIAGNOSIS — O479 False labor, unspecified: Secondary | ICD-10-CM

## 2021-04-19 DIAGNOSIS — R1030 Lower abdominal pain, unspecified: Secondary | ICD-10-CM | POA: Diagnosis present

## 2021-04-19 DIAGNOSIS — Z0371 Encounter for suspected problem with amniotic cavity and membrane ruled out: Secondary | ICD-10-CM

## 2021-04-19 DIAGNOSIS — Z3A30 30 weeks gestation of pregnancy: Secondary | ICD-10-CM | POA: Diagnosis not present

## 2021-04-19 DIAGNOSIS — N898 Other specified noninflammatory disorders of vagina: Secondary | ICD-10-CM

## 2021-04-19 DIAGNOSIS — R1032 Left lower quadrant pain: Secondary | ICD-10-CM | POA: Insufficient documentation

## 2021-04-19 DIAGNOSIS — O26893 Other specified pregnancy related conditions, third trimester: Secondary | ICD-10-CM | POA: Insufficient documentation

## 2021-04-19 LAB — URINALYSIS, ROUTINE W REFLEX MICROSCOPIC
Bilirubin Urine: NEGATIVE
Glucose, UA: NEGATIVE mg/dL
Hgb urine dipstick: NEGATIVE
Ketones, ur: 5 mg/dL — AB
Leukocytes,Ua: NEGATIVE
Nitrite: NEGATIVE
Protein, ur: NEGATIVE mg/dL
Specific Gravity, Urine: 1.013 (ref 1.005–1.030)
pH: 7 (ref 5.0–8.0)

## 2021-04-19 LAB — WET PREP, GENITAL
Clue Cells Wet Prep HPF POC: NONE SEEN
Sperm: NONE SEEN
Trich, Wet Prep: NONE SEEN
WBC, Wet Prep HPF POC: <10 (ref <10)
Yeast Wet Prep HPF POC: NONE SEEN

## 2021-04-19 LAB — AMNISURE RUPTURE OF MEMBRANE (ROM) NOT AT ARMC: Amnisure ROM: NEGATIVE

## 2021-04-19 MED ORDER — LACTATED RINGERS IV BOLUS
1000.0000 mL | Freq: Once | INTRAVENOUS | Status: AC
  Administered 2021-04-19: 18:00:00 1000 mL via INTRAVENOUS

## 2021-04-19 MED ORDER — ACETAMINOPHEN 500 MG PO TABS: 1000.0000 mg | ORAL_TABLET | Freq: Four times a day (QID) | ORAL | 0 refills | Status: DC | PRN

## 2021-04-19 MED ORDER — ACETAMINOPHEN 500 MG PO TABS
1000.0000 mg | ORAL_TABLET | Freq: Four times a day (QID) | ORAL | Status: DC | PRN
  Administered 2021-04-19: 18:00:00 1000 mg via ORAL
  Filled 2021-04-19: qty 2

## 2021-04-19 NOTE — MAU Note (Signed)
Pt reports left lower abdominal since 0930.   Denies vaginal bleeding.   Reports leaking fluid earlier today none since the one occurrence, but does not know if it was urine or not.   Reports decreased fetal movement since yesterday.

## 2021-04-19 NOTE — Discharge Instructions (Signed)
You came to the hospital because you had lower belly pain and also some leaking. We did a pelvic exam and did not see a lot of fluid and did a swab which showed your water is NOT broken. Your pain got better with tylenol and fluids.  We think the pain was likely related to you being dehydrated. We recommend drinking atleast 4-5 regular sized bottles of water a day.  Please seek medical care if you have vaginal bleeding (more than spotting), worsening belly pain that does not get better with medications, don't feel your baby move, or have leaking of thin fluid that is soaking your underwear.

## 2021-04-19 NOTE — MAU Provider Note (Signed)
History     CSN: [id number]  Arrival date and time: 04/19/21 1608   None     Chief Complaint  Patient presents with   Abdominal Pain   HPI 21 yo F G3P0020 at 54w0dwho presents with left lower abdominal pain that is constant 6/10 abdominal pain and then intermittently worsens to 10/10.   Worse with walking Sitting up worse No bleeding Some discharge Feeling movement Some discharge but no leaking   OB History     Gravida  3   Para  0   Term      Preterm      AB  2   Living         SAB  2   IAB      Ectopic      Multiple      Live Births              Past Medical History:  Diagnosis Date   Asthma    Depression    therapist said bipolar, pt does not want to take meds   Ovarian cyst     Past Surgical History:  Procedure Laterality Date   NO PAST SURGERIES      Family History  Problem Relation Age of Onset   Bipolar disorder Mother    Healthy Father     Social History   Tobacco Use   Smoking status: Never   Smokeless tobacco: Never  Vaping Use   Vaping Use: Never used  Substance Use Topics   Alcohol use: No   Drug use: Never    Allergies: No Known Allergies  Medications Prior to Admission  Medication Sig Dispense Refill Last Dose   Blood Pressure Monitoring (BLOOD PRESSURE KIT) DEVI 1 Device by Does not apply route as needed. 1 each 0    Prenatal Vit-Fe Phos-FA-Omega (VITAFOL GUMMIES) 3.33-0.333-34.8 MG CHEW Chew 1 tablet by mouth daily. 90 tablet 11     Review of Systems Physical Exam   Blood pressure (!) 108/57, pulse 92, temperature (!) 97.5 F (36.4 C), resp. rate 12, last menstrual period 09/21/2020, SpO2 100 %.  Physical Exam Vitals and nursing note reviewed. Exam conducted with a chaperone present.  HENT:     Head: Normocephalic.  Abdominal:     Palpations: Abdomen is soft.     Tenderness: There is abdominal tenderness in the right lower quadrant and left lower quadrant.     Comments: Gravid   Genitourinary:    Cervix: No friability.     Comments: Speculum exam done, no pooling. Thick mucousy discharge noted at cervical os. Os appears closed on visualization. No bleeding. GC/CT, wet prep, and amnisure collected Skin:    Capillary Refill: Capillary refill takes less than 2 seconds.  Neurological:     General: No focal deficit present.     Mental Status: She is alert.    MAU Course  Procedures NST: Baseline 135, accels present (10X10), no decel Reactive  MDM 21yo F G3P0020 at 324w0dho presents with left lower abdominal pain that is constant 6/10 abdominal pain and then intermittently worsens to 10/10. Abdominal discomfort improved with IV fluids and Tylenol. No contractions on monitor, just irritability and since cervix closed on visual exam labor ruled out   Assessment and Plan   Vaginal discharge/leaking Concern for ROM Negative pool. Amnisure negative. Swabs collected for GC/CT and wet prep. Wet prep found to be negative  3. Contractions Reports LLQ abdominal pain. Constant and then intermittently  worsens. On monitor uterine irritability noted however no large contractions. Cervix closed on visual exam. Abdominal discomfort resolved along with subjective contractions with IV fluids and Tylenol. Safe to discharge home with return precautions.  Renard Matter 04/19/2021, 4:52 PM

## 2021-04-21 LAB — GC/CHLAMYDIA PROBE AMP (~~LOC~~) NOT AT ARMC
Chlamydia: NEGATIVE
Comment: NEGATIVE
Comment: NORMAL
Neisseria Gonorrhea: NEGATIVE

## 2021-04-24 ENCOUNTER — Encounter: Payer: Medicaid Other | Admitting: Advanced Practice Midwife

## 2021-05-01 ENCOUNTER — Ambulatory Visit (INDEPENDENT_AMBULATORY_CARE_PROVIDER_SITE_OTHER): Payer: Medicaid Other | Admitting: Advanced Practice Midwife

## 2021-05-01 ENCOUNTER — Other Ambulatory Visit: Payer: Self-pay

## 2021-05-01 VITALS — BP 96/53 | HR 89 | Wt 123.0 lb

## 2021-05-01 DIAGNOSIS — R569 Unspecified convulsions: Secondary | ICD-10-CM

## 2021-05-01 DIAGNOSIS — Z3A31 31 weeks gestation of pregnancy: Secondary | ICD-10-CM

## 2021-05-01 DIAGNOSIS — Z3400 Encounter for supervision of normal first pregnancy, unspecified trimester: Secondary | ICD-10-CM

## 2021-05-01 DIAGNOSIS — O1203 Gestational edema, third trimester: Secondary | ICD-10-CM

## 2021-05-01 NOTE — Patient Instructions (Signed)
?  Considering Waterbirth? ?Guide for patients at [facility] ?Why consider waterbirth? ?Gentle birth for babies  ?Less pain medicine used in labor  ?May allow for passive descent/less pushing  ?May reduce perineal tears  ?More mobility and instinctive maternal position changes  ?Increased maternal relaxation  ? ?Is waterbirth safe? What are the risks of infection, drowning or other complications? ?Infection:  ?Very low risk (3.7 % for tub vs 4.8% for bed)  ?7 in 8000 waterbirths with documented infection  ?Poorly cleaned equipment most common cause  ?Slightly lower group B strep transmission rate  ?Drowning  ?Maternal:  ?Very low risk  ?Related to seizures or fainting  ?Newborn:  ?Very low risk. No evidence of increased risk of respiratory problems in multiple large studies  ?Physiological protection from breathing under water  ?Avoid underwater birth if there are any fetal complications  ?Once baby's head is out of the water, keep it out.  ?Birth complication  ?Some reports of cord trauma, but risk decreased by bringing baby to surface gradually  ?No evidence of increased risk of shoulder dystocia. Mothers can usually change positions faster in water than in a bed, possibly aiding the maneuvers to free the shoulder.  ? ?There are 2 things you MUST do to have a waterbirth with [facility]: ?Attend a waterbirth class at [facility]   ?3rd Wednesday of every month from 7-9 pm (virtual during COVID) ?Free ?Register online at [email or web address] or [email or web address] or by calling [phone] ?Bring us the certificate from the class to your prenatal appointment or send via MyChart ?Meet with a midwife at 36 weeks* to see if you can still plan a waterbirth and to sign the consent.  ? ?*We also recommend that you schedule as many of your prenatal visits with a midwife as possible.   ? ?Helpful information: ?You may want to bring a bathing suit top to the hospital  to wear during labor but this is optional.  All other supplies are provided by the hospital. ?Please arrive at the hospital with signs of active labor, and do not wait at home until late in labor. It takes 45 min- 2 hours for COVID testing, fetal monitoring, and check in to your room to take place, plus transport and filling of the waterbirth tub.   ? ?Things that would prevent you from having a waterbirth: ?Unknown or Positive COVID-19 diagnosis upon admission to hospital* ?Premature, <37wks  ?Previous cesarean birth  ?Presence of thick meconium-stained fluid  ?Multiple gestation (Twins, triplets, etc.)  ?Uncontrolled diabetes or gestational diabetes requiring medication  ?Hypertension diagnosed in pregnancy or preexisting hypertension (gestational hypertension, preeclampsia, or chronic hypertension) ?Fetal growth restriction (your baby measures less than 10th percentile on ultrasound) ?Heavy vaginal bleeding  ?Non-reassuring fetal heart rate  ?Active infection (MRSA, etc.). Group B Strep is NOT a contraindication for waterbirth.  ?If your labor has to be induced and induction method requires continuous monitoring of the baby's heart rate  ?Other risks/issues identified by your obstetrical provider  ? ?Please remember that birth is unpredictable. Under certain unforeseeable circumstances your provider may advise against giving birth in the tub. These decisions will be made on a case-by-case basis and with the safety of you and your baby as our highest priority. ? ? ?*Please remember that in order to have a waterbirth, you must test Negative to COVID-19 upon admission to the hospital. ? ?Updated 07/04/20 ? ?

## 2021-05-01 NOTE — Progress Notes (Signed)
° °  PRENATAL VISIT NOTE  Subjective:  Brianna Thornton is a 21 y.o. G3P0020 at [redacted]w[redacted]d being seen today for ongoing prenatal care.  She is currently monitored for the following issues for this low-risk pregnancy and has Seizure-like activity ([place]); Episodic tension-type headache, not intractable; Encounter for supervision of normal pregnancy, antepartum; and History of COVID-19 on their problem list.  Patient reports  swelling of feet/ankles after work .  Contractions: Irritability. Vag. Bleeding: None.  Movement: Present. Denies leaking of fluid.   The following portions of the patient's history were reviewed and updated as appropriate: allergies, current medications, past family history, past medical history, past social history, past surgical history and problem list.   Objective:   Vitals:   05/01/21 1337  BP: (!) 96/53  Pulse: 89  Weight: 123 lb (55.8 kg)    Fetal Status: Fetal Heart Rate (bpm): 138 Fundal Height: 30 cm Movement: Present     General:  Alert, oriented and cooperative. Patient is in no acute distress.  Skin: Skin is warm and dry. No rash noted.   Cardiovascular: Normal heart rate noted  Respiratory: Normal respiratory effort, no problems with respiration noted  Abdomen: Soft, gravid, appropriate for gestational age.  Pain/Pressure: Absent     Pelvic: Cervical exam deferred        Extremities: Normal range of motion.     Mental Status: Normal mood and affect. Normal behavior. Normal judgment and thought content.   Assessment and Plan:  Pregnancy: [id number] at [redacted]w[redacted]d 1. Supervision of normal first pregnancy, antepartum --Anticipatory guidance about next visits/weeks of pregnancy given.  --Interested in [place], questions answered. Pt to enroll in class, see midwives for most visits. Discussed waterbirth as option for low risk pregnancy. Pregnancy is hard to predict and things may arise that prevent immersion in water but labor can be supported with freedom of movement,  birthing ball, shower and birth can be supported in position of pt choosing, etc, even if waterbirth becomes unavailable. Pt states understanding.  --Next appt in 2 weeks with midwife  2. [redacted] weeks gestation of pregnancy   3. Edema during pregnancy in third trimester --No edema on exam today. 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 Seizure-like activity ([place]) --Single episode of seizure vs syncope in 2019 with ED visit.  Pt followed up and was cleared by neurology. No other incident since this single episode. --Neurology records requested but likely syncopal episode   Preterm labor symptoms and general obstetric precautions including but not limited to vaginal bleeding, contractions, leaking of fluid and fetal movement were reviewed in detail with the patient. Please refer to After Visit Summary for other counseling recommendations.   Return in about 2 weeks (around 05/15/2021).  Future Appointments  Date Time Provider [place]  05/05/2021 10:30 AM [facility] NURSE [facility]  05/05/2021 10:45 AM [facility]  05/15/2021  1:50 PM Patriciaann Clan, DO [place] None    Fatima Blank, CNM

## 2021-05-03 ENCOUNTER — Encounter: Payer: Self-pay | Admitting: Advanced Practice Midwife

## 2021-05-05 ENCOUNTER — Ambulatory Visit: Payer: Medicaid Other | Admitting: *Deleted

## 2021-05-05 ENCOUNTER — Encounter: Payer: Self-pay | Admitting: *Deleted

## 2021-05-05 ENCOUNTER — Ambulatory Visit: Payer: Medicaid Other | Attending: Obstetrics and Gynecology

## 2021-05-05 ENCOUNTER — Other Ambulatory Visit: Payer: Self-pay

## 2021-05-05 VITALS — BP 120/64 | HR 90

## 2021-05-05 DIAGNOSIS — Z362 Encounter for other antenatal screening follow-up: Secondary | ICD-10-CM | POA: Diagnosis not present

## 2021-05-05 DIAGNOSIS — U071 COVID-19: Secondary | ICD-10-CM | POA: Insufficient documentation

## 2021-05-05 DIAGNOSIS — Z3689 Encounter for other specified antenatal screening: Secondary | ICD-10-CM

## 2021-05-05 DIAGNOSIS — O98512 Other viral diseases complicating pregnancy, second trimester: Secondary | ICD-10-CM | POA: Diagnosis present

## 2021-05-05 DIAGNOSIS — R748 Abnormal levels of other serum enzymes: Secondary | ICD-10-CM | POA: Diagnosis present

## 2021-05-05 DIAGNOSIS — Z3A32 32 weeks gestation of pregnancy: Secondary | ICD-10-CM

## 2021-05-15 ENCOUNTER — Other Ambulatory Visit: Payer: Self-pay

## 2021-05-15 ENCOUNTER — Encounter: Payer: Self-pay | Admitting: Family Medicine

## 2021-05-15 ENCOUNTER — Ambulatory Visit (INDEPENDENT_AMBULATORY_CARE_PROVIDER_SITE_OTHER): Payer: Medicaid Other | Admitting: Family Medicine

## 2021-05-15 VITALS — BP 110/56 | HR 89 | Wt 126.8 lb

## 2021-05-15 DIAGNOSIS — Z3A33 33 weeks gestation of pregnancy: Secondary | ICD-10-CM

## 2021-05-15 DIAGNOSIS — Z8616 Personal history of COVID-19: Secondary | ICD-10-CM

## 2021-05-15 DIAGNOSIS — Z3400 Encounter for supervision of normal first pregnancy, unspecified trimester: Secondary | ICD-10-CM

## 2021-05-15 DIAGNOSIS — K219 Gastro-esophageal reflux disease without esophagitis: Secondary | ICD-10-CM

## 2021-05-15 MED ORDER — FAMOTIDINE 20 MG PO TABS: 20.0000 mg | ORAL_TABLET | Freq: Two times a day (BID) | ORAL | 1 refills | Status: DC

## 2021-05-15 NOTE — Progress Notes (Signed)
° ° °  Subjective:  Nayellie Sanseverino is a 21 y.o. G3P0020 at [redacted]w[redacted]d being seen today for ongoing prenatal care.  She is currently monitored for the following issues for this low-risk pregnancy and has Seizure-like activity ([facility]); Episodic tension-type headache, not intractable; Encounter for supervision of normal pregnancy, antepartum; and History of COVID-19 on their problem list.  Patient reports heartburn that has been worse in her third trimester. Has not tried anything to make it better. No specific food makes it worse.  Contractions: Irritability. Vag. Bleeding: None.  Movement: Present. Denies leaking of fluid.   The following portions of the patient's history were reviewed and updated as appropriate: allergies, current medications, past family history, past medical history, past social history, past surgical history and problem list.   Objective:   Vitals:   05/15/21 1402  BP: (!) 110/56  Pulse: 89  Weight: 126 lb 12.8 oz (57.5 kg)    Fetal Status: Fetal Heart Rate (bpm): 134 Fundal Height: 33 cm Movement: Present     General:  Alert, oriented and cooperative. Patient is in no acute distress.  Skin: Skin is warm and dry. No rash noted.   Cardiovascular: Normal heart rate noted  Respiratory: Normal respiratory effort, no problems with respiration noted  Abdomen: Soft, gravid, appropriate for gestational age. Pain/Pressure: Absent     Pelvic:  Cervical exam deferred        Extremities: Normal range of motion.     Mental Status: Normal mood and affect. Normal behavior. Normal judgment and thought content.    Assessment and Plan:  Pregnancy: G3P0020 at [redacted]w[redacted]d  1. Supervision of normal first pregnancy, antepartum Doing well with normal fetal movement.   2. [redacted] weeks gestation of pregnancy Discussed gc/ch/GBS swabs during next visit.   3. History of COVID-19 Recent growth [place] normal.  4. Gastroesophageal reflux disease, unspecified whether esophagitis present Start Pepcid BID, tums  PRN. Encouraged small frequent meals with avoiding triggers. Can do PPI if not improving with these measures.   5. Seizures vs syncope Back in 2019, cleared by neurology and likely syncopal. No episodes since and not on medications. Waiting for records.   Preterm labor symptoms and general obstetric precautions including but not limited to vaginal bleeding, contractions, leaking of fluid and fetal movement were reviewed in detail with the patient. Please refer to After Visit Summary for other counseling recommendations.   Return in about 2 weeks (around 05/29/2021) for LROB with GBS with CNM .   Allayne Stack, DO

## 2021-05-15 NOTE — Progress Notes (Signed)
Pt presents in office for ROB. She complains of severe acid reflux which is interfering with her job. She has not other concerns tat this time.

## 2021-05-16 ENCOUNTER — Inpatient Hospital Stay (HOSPITAL_COMMUNITY)
Admission: AD | Admit: 2021-05-16 | Discharge: 2021-05-16 | Disposition: A | Discharge status: Home / Self Care | Payer: Medicaid Other | Attending: Obstetrics and Gynecology | Admitting: Obstetrics and Gynecology

## 2021-05-16 ENCOUNTER — Other Ambulatory Visit: Payer: Self-pay

## 2021-05-16 ENCOUNTER — Encounter (HOSPITAL_COMMUNITY): Payer: Self-pay | Admitting: Obstetrics and Gynecology

## 2021-05-16 DIAGNOSIS — Z0371 Encounter for suspected problem with amniotic cavity and membrane ruled out: Secondary | ICD-10-CM

## 2021-05-16 DIAGNOSIS — Z3483 Encounter for supervision of other normal pregnancy, third trimester: Secondary | ICD-10-CM | POA: Insufficient documentation

## 2021-05-16 DIAGNOSIS — Z3A33 33 weeks gestation of pregnancy: Secondary | ICD-10-CM

## 2021-05-16 DIAGNOSIS — O26893 Other specified pregnancy related conditions, third trimester: Secondary | ICD-10-CM | POA: Diagnosis not present

## 2021-05-16 LAB — AMNISURE RUPTURE OF MEMBRANE (ROM) NOT AT ARMC: Amnisure ROM: NEGATIVE

## 2021-05-16 NOTE — MAU Note (Signed)
PT SAYS AT 2344- SHE WAS SITTING ON TOILET - AND SHE FELT LIQUID COMING OUT  NO [facility] WITH [facility] DENIES HSV

## 2021-05-16 NOTE — MAU Provider Note (Signed)
History     [id number]  Arrival date and time: 05/16/21 0023    Chief Complaint  Patient presents with   Rupture of Membranes     HPI Brianna Thornton is a 21 y.o. at 70w6dby LMP who presents for vaginal discharge. Reports episode of fluid leaking while sitting on the toilet just prior to midnight. She then leaked some on the floor. Blotted fluid up with a white towel & didn't notice any color to it. Denies abdominal pain, vaginal bleeding, vaginal discharge, vaginal irritation, or recent intercourse.   Vaginal bleeding: No LOF: Yes Fetal Movement: Yes Contractions: No   OB History     Gravida  3   Para  0   Term      Preterm      AB  2   Living         SAB  2   IAB      Ectopic      Multiple      Live Births              Past Medical History:  Diagnosis Date   Asthma    Depression    therapist said bipolar, pt does not want to take meds   Ovarian cyst     Past Surgical History:  Procedure Laterality Date   NO PAST SURGERIES      Family History  Problem Relation Age of Onset   Bipolar disorder Mother    Healthy Father     No Known Allergies  No current facility-administered medications on file prior to encounter.   Current Outpatient Medications on File Prior to Encounter  Medication Sig Dispense Refill   Prenatal Vit-Fe Phos-FA-Omega (VITAFOL GUMMIES) 3.33-0.333-34.8 MG CHEW Chew 1 tablet by mouth daily. 90 tablet 11   acetaminophen (TYLENOL) 500 MG tablet Take 2 tablets (1,000 mg total) by mouth every 6 (six) hours as needed (once). (Patient not taking: Reported on 05/15/2021) 30 tablet 0   Blood Pressure Monitoring (BLOOD PRESSURE KIT) DEVI 1 Device by Does not apply route as needed. 1 each 0   famotidine (PEPCID) 20 MG tablet Take 1 tablet (20 mg total) by mouth 2 (two) times daily. 60 tablet 1     ROS Pertinent positives and negative per HPI, all others reviewed and negative  Physical Exam   BP 111/64 (BP Location: Right Arm)     Pulse (!) 104    Temp 98.1 F (36.7 C) (Oral)    Resp 20    Ht 5' 1.5" (1.562 m)    Wt 58.3 kg    LMP 09/21/2020    BMI 23.89 kg/m   Patient Vitals for the past 24 hrs:  BP Temp Temp src Pulse Resp Height Weight  05/16/21 0210 -- -- -- -- 20 -- --  05/16/21 0040 111/64 98.1 F (36.7 C) Oral (!) 104 20 5' 1.5" (1.562 m) 58.3 kg    Physical Exam Vitals and nursing note reviewed. Exam conducted with a chaperone present.  Constitutional:      General: She is not in acute distress.    Appearance: Normal appearance.  HENT:     Head: Normocephalic and atraumatic.  Eyes:     General: No scleral icterus.    Conjunctiva/sclera: Conjunctivae normal.  Pulmonary:     Effort: Pulmonary effort is normal.  Abdominal:     Palpations: Abdomen is soft.     Tenderness: There is no abdominal tenderness.  Genitourinary:    Comments:  Pelvic exam: difficult to complete due to patient's discomfort on exam but no active leaking noted.  Skin:    General: Skin is warm and dry.  Neurological:     Mental Status: She is alert.  Psychiatric:        Mood and Affect: Mood normal.        Behavior: Behavior normal.     FHT Baseline 135, moderate variability, 15x15 accels, no decels Toco: irregular Cat: 1  Labs Results for orders placed or performed during the hospital encounter of 05/16/21 (from the past 24 hour(s))  Amnisure rupture of membrane (rom)not at [facility]     Status: None   Collection Time: 05/16/21  1:40 AM  Result Value Ref Range   Amnisure ROM NEGATIVE     Imaging No results found.  MAU Course  Procedures Lab Orders         Amnisure rupture of membrane (rom)not at [facility]     No orders of the defined types were placed in this encounter.  Imaging Orders  No imaging studies ordered today    MDM Unable to obtain fern or assess for pooling due to patient discomfort with speculum exam. Amnisure order & results reviewed. Amnisure negative.   Assessment and Plan   1. Encounter for  suspected PROM, with rupture of membranes not found  -Amnisure negative  2. [redacted] weeks gestation of pregnancy      Jorje Guild, NP 05/16/21 2:53 AM

## 2021-05-17 ENCOUNTER — Inpatient Hospital Stay (HOSPITAL_COMMUNITY)
Admission: AD | Admit: 2021-05-17 | Discharge: 2021-05-18 | Disposition: A | Discharge status: Home / Self Care | Payer: Medicaid Other | Attending: Obstetrics & Gynecology | Admitting: Obstetrics & Gynecology

## 2021-05-17 ENCOUNTER — Encounter (HOSPITAL_COMMUNITY): Payer: Self-pay | Admitting: Obstetrics & Gynecology

## 2021-05-17 DIAGNOSIS — Z3A34 34 weeks gestation of pregnancy: Secondary | ICD-10-CM | POA: Diagnosis not present

## 2021-05-17 DIAGNOSIS — N898 Other specified noninflammatory disorders of vagina: Secondary | ICD-10-CM | POA: Insufficient documentation

## 2021-05-17 DIAGNOSIS — Z3689 Encounter for other specified antenatal screening: Secondary | ICD-10-CM | POA: Insufficient documentation

## 2021-05-17 DIAGNOSIS — R109 Unspecified abdominal pain: Secondary | ICD-10-CM | POA: Diagnosis not present

## 2021-05-17 DIAGNOSIS — O4700 False labor before 37 completed weeks of gestation, unspecified trimester: Secondary | ICD-10-CM | POA: Diagnosis not present

## 2021-05-17 DIAGNOSIS — O26893 Other specified pregnancy related conditions, third trimester: Secondary | ICD-10-CM | POA: Diagnosis present

## 2021-05-17 LAB — WET PREP, GENITAL
Clue Cells Wet Prep HPF POC: NONE SEEN
Sperm: NONE SEEN
Trich, Wet Prep: NONE SEEN
WBC, Wet Prep HPF POC: <10 (ref <10)
Yeast Wet Prep HPF POC: NONE SEEN

## 2021-05-17 LAB — URINALYSIS, ROUTINE W REFLEX MICROSCOPIC
Bilirubin Urine: NEGATIVE
Glucose, UA: NEGATIVE mg/dL
Hgb urine dipstick: NEGATIVE
Ketones, ur: NEGATIVE mg/dL
Leukocytes,Ua: NEGATIVE
Nitrite: NEGATIVE
Protein, ur: NEGATIVE mg/dL
Specific Gravity, Urine: 1.008 (ref 1.005–1.030)
pH: 6 (ref 5.0–8.0)

## 2021-05-17 MED ORDER — TERBUTALINE SULFATE 1 MG/ML IJ SOLN
0.2500 mg | Freq: Once | INTRAMUSCULAR | Status: AC
  Administered 2021-05-18: 0.25 mg via SUBCUTANEOUS
  Filled 2021-05-17: qty 1

## 2021-05-17 NOTE — MAU Note (Signed)
Pt presented to MAU viz EMS for CTX every 3-4 mins since 2000, with a pain rating of 7/10. Pt states she been pretty hydrated, and was not able to rest until EMS picked her up. Pt reports milky discharge since Monday, no odor. Pt denies VB, LOF, DFM, and complications in the pregnancy. Pt states no recent intercourse.

## 2021-05-17 NOTE — MAU Provider Note (Signed)
Chief Complaint:  Contractions   Event Date/Time   First Provider Initiated Contact with Patient 05/17/21 2202     HPI: Brianna Thornton is a 21 y.o. G3P0020 at 22w0dho presents to maternity admissions reporting contractions since 8pm "at work".  States they got better when EMS came. Seen yesterday for discharge, no evid of ROM..Marland KitchenShe reports good fetal movement, denies LOF, vaginal bleeding, vaginal itching/burning, urinary symptoms, h/a, dizziness, n/v, diarrhea, constipation or fever/chills.   Abdominal Pain This is a new problem. The current episode started today. The onset quality is gradual. The problem occurs intermittently. The problem is unchanged. The pain is mild. The quality of the pain is described as cramping. Pertinent negatives include no constipation, diarrhea, dysuria, fever or frequency. Nothing relieves the symptoms. Past treatments include nothing.     RN Note: Pt presented to MAU viz EMS for CTX every 3-4 mins since 2000, with a pain rating of 7/10. Pt states she been pretty hydrated, and was not able to rest until EMS picked her up. Pt reports milky discharge since Monday, no odor. Pt denies VB, LOF, DFM, and complications in the pregnancy. Pt states no recent intercourse.   Past Medical History: Past Medical History:  Diagnosis Date   Asthma    Depression    therapist said bipolar, pt does not want to take meds   Ovarian cyst     Past obstetric history: OB History  Gravida Para Term Preterm AB Living  3 0     2    SAB IAB Ectopic Multiple Live Births  2            # Outcome Date GA Lbr Len/2nd Weight Sex Delivery Anes PTL Lv  3 Current           2 SAB           1 SAB             Past Surgical History: Past Surgical History:  Procedure Laterality Date   NO PAST SURGERIES      Family History: Family History  Problem Relation Age of Onset   Bipolar disorder Mother    Healthy Father     Social History: Social History   Tobacco Use   Smoking status:  Never   Smokeless tobacco: Never  Vaping Use   Vaping Use: Never used  Substance Use Topics   Alcohol use: No   Drug use: Never    Allergies: No Known Allergies  Meds:  Medications Prior to Admission  Medication Sig Dispense Refill Last Dose   famotidine (PEPCID) 20 MG tablet Take 1 tablet (20 mg total) by mouth 2 (two) times daily. 60 tablet 1 05/17/2021   Prenatal Vit-Fe Phos-FA-Omega (VITAFOL GUMMIES) 3.33-0.333-34.8 MG CHEW Chew 1 tablet by mouth daily. 90 tablet 11 05/17/2021   acetaminophen (TYLENOL) 500 MG tablet Take 2 tablets (1,000 mg total) by mouth every 6 (six) hours as needed (once). (Patient not taking: Reported on 05/15/2021) 30 tablet 0    Blood Pressure Monitoring (BLOOD PRESSURE KIT) DEVI 1 Device by Does not apply route as needed. 1 each 0     I have reviewed patient's Past Medical Hx, Surgical Hx, Family Hx, Social Hx, medications and allergies.   ROS:  Review of Systems  Constitutional:  Negative for fever.  Gastrointestinal:  Positive for abdominal pain. Negative for constipation and diarrhea.  Genitourinary:  Negative for dysuria and frequency.  Other systems negative  Physical Exam  Patient Vitals for  the past 24 hrs:  BP Temp Temp src Pulse Resp SpO2  05/17/21 2143 (!) 116/57 98 F (36.7 C) Oral 87 18 100 %   Constitutional: Well-developed, well-nourished female in no acute distress.  Cardiovascular: normal rate and rhythm Respiratory: normal effort GI: Abd soft, non-tender, gravid appropriate for gestational age.   No rebound or guarding. MS: Extremities nontender, no edema, normal ROM Neurologic: Alert and oriented x 4.  GU: Neg CVAT.  PELVIC EXAM:   Dilation: Closed Effacement (%): 50 Cervical Position: Posterior Station: -3 Presentation: Vertex Exam by:: Jimmye Norman CNM   FHT:  Baseline 130 , moderate variability, accelerations present, no decelerations Contractions:  Irregular    Labs: Results for orders placed or performed during the  hospital encounter of 05/17/21 (from the past 24 hour(s))  Urinalysis, Routine w reflex microscopic Urine, Clean Catch     Status: Abnormal   Collection Time: 05/17/21  9:29 PM  Result Value Ref Range   Color, Urine STRAW (A) YELLOW   APPearance CLEAR CLEAR   Specific Gravity, Urine 1.008 1.005 - 1.030   pH 6.0 5.0 - 8.0   Glucose, UA NEGATIVE NEGATIVE mg/dL   Hgb urine dipstick NEGATIVE NEGATIVE   Bilirubin Urine NEGATIVE NEGATIVE   Ketones, ur NEGATIVE NEGATIVE mg/dL   Protein, ur NEGATIVE NEGATIVE mg/dL   Nitrite NEGATIVE NEGATIVE   Leukocytes,Ua NEGATIVE NEGATIVE  Wet prep, genital     Status: None   Collection Time: 05/17/21 11:07 PM  Result Value Ref Range   Yeast Wet Prep HPF POC NONE SEEN NONE SEEN   Trich, Wet Prep NONE SEEN NONE SEEN   Clue Cells Wet Prep HPF POC NONE SEEN NONE SEEN   WBC, Wet Prep HPF POC <10 <10   Sperm NONE SEEN     O/Positive/-- (10/27 1100)  Imaging:    MAU Course/MDM: I have ordered labs and reviewed results. UA and Wet prep negative NST reviewed, reassuring  Treatments in MAU included Terbutaline which did stop contractions.    Assessment: Single IUP at 34w1dPreterm uterine contractions, no change in cervix  Plan: Discharge home Preterm Labor precautions and fetal kick counts Follow up in Office for prenatal visits  Encouraged to return if she develops worsening of symptoms, increase in pain, fever, or other concerning symptoms.  Pt stable at time of discharge.  MHansel FeinsteinCNM, MSN Certified Nurse-Midwife 05/17/2021 10:02 PM

## 2021-05-18 DIAGNOSIS — O4700 False labor before 37 completed weeks of gestation, unspecified trimester: Secondary | ICD-10-CM

## 2021-05-18 DIAGNOSIS — O26893 Other specified pregnancy related conditions, third trimester: Secondary | ICD-10-CM

## 2021-05-18 DIAGNOSIS — N898 Other specified noninflammatory disorders of vagina: Secondary | ICD-10-CM

## 2021-05-18 DIAGNOSIS — Z3A34 34 weeks gestation of pregnancy: Secondary | ICD-10-CM

## 2021-05-18 LAB — GC/CHLAMYDIA PROBE AMP (~~LOC~~) NOT AT ARMC
Chlamydia: NEGATIVE
Comment: NEGATIVE
Comment: NORMAL
Neisseria Gonorrhea: NEGATIVE

## 2021-05-31 ENCOUNTER — Encounter: Payer: Self-pay | Admitting: Obstetrics and Gynecology

## 2021-05-31 ENCOUNTER — Ambulatory Visit (INDEPENDENT_AMBULATORY_CARE_PROVIDER_SITE_OTHER): Payer: Medicaid Other | Admitting: Obstetrics and Gynecology

## 2021-05-31 ENCOUNTER — Other Ambulatory Visit: Payer: Self-pay

## 2021-05-31 VITALS — BP 107/67 | HR 87 | Wt 127.4 lb

## 2021-05-31 DIAGNOSIS — R569 Unspecified convulsions: Secondary | ICD-10-CM

## 2021-05-31 DIAGNOSIS — Z3400 Encounter for supervision of normal first pregnancy, unspecified trimester: Secondary | ICD-10-CM

## 2021-05-31 NOTE — Progress Notes (Signed)
? ?  PRENATAL VISIT NOTE ? ?Subjective:  ?Brianna Thornton is a 20 y.o. G3P0020 at [redacted]w[redacted]d being seen today for ongoing prenatal care.  She is currently monitored for the following issues for this low-risk pregnancy and has Seizure-like activity (HCC); Episodic tension-type headache, not intractable; Encounter for supervision of normal pregnancy, antepartum; and History of COVID-19 on their problem list. ? ?Patient reports no complaints.  Contractions: Irritability. Vag. Bleeding: None.  Movement: Present. Denies leaking of fluid.  ? ?The following portions of the patient's history were reviewed and updated as appropriate: allergies, current medications, past family history, past medical history, past social history, past surgical history and problem list.  ? ?Objective:  ? ?Vitals:  ? 05/31/21 1503  ?BP: 107/67  ?Pulse: 87  ?Weight: 127 lb 6.4 oz (57.8 kg)  ? ? ?Fetal Status: Fetal Heart Rate (bpm): 135 Fundal Height: 36 cm Movement: Present  Presentation: Undeterminable ? ?General:  Alert, oriented and cooperative. Patient is in no acute distress.  ?Skin: Skin is warm and dry. No rash noted.   ?Cardiovascular: Normal heart rate noted  ?Respiratory: Normal respiratory effort, no problems with respiration noted  ?Abdomen: Soft, gravid, appropriate for gestational age.  Pain/Pressure: Absent     ?Pelvic: Cervical exam deferred Dilation: Fingertip Effacement (%): Thick    ?Extremities: Normal range of motion.  Edema: None  ?Mental Status: Normal mood and affect. Normal behavior. Normal judgment and thought content.  ? ?Assessment and Plan:  ?Pregnancy: G3P0020 at [redacted]w[redacted]d ?1. Seizure-like activity (HCC) ? ?Patient is planning water birth. She has taken the class. She has not signed a consent form for WB with a CNM. She is aware that she has seizure like activity on her chart. She was seen my Dr. Sharene Skeans in 2019, per the medical record this was stated in his note following her neuro workup.  ?Impression: ?This is a normal record  with the patient awake.  A normal EEG does not rule out the presence of seizures. ?Ellison Carwin, MD ? ?Notified patient that her chart would be sent to the CNM group to review whether or not she is a candidate for water birth. I was direct with the patient in explaining that with her history of seizure like activity; this may not make her a candidate for water birth.  ? ?2. Supervision of normal first pregnancy, antepartum ? ?GC Negative on 2/22 ?GBS collected today.  ? ?Preterm labor symptoms and general obstetric precautions including but not limited to vaginal bleeding, contractions, leaking of fluid and fetal movement were reviewed in detail with the patient. ?Please refer to After Visit Summary for other counseling recommendations.  ? ?Return in about 1 week (around 06/07/2021), or with CNM. ? ?Future Appointments  ?Date Time Provider Department Center  ?06/09/2021  9:35 AM Gerrit Heck, CNM [facility] None  ?06/14/2021 12:15 PM Klett, Pascal Lux, NP [facility]  ? ? ?Venia Carbon, NP  ?

## 2021-06-04 LAB — CULTURE, BETA STREP (GROUP B ONLY): Strep Gp B Culture: NEGATIVE

## 2021-06-09 ENCOUNTER — Other Ambulatory Visit: Payer: Self-pay

## 2021-06-09 ENCOUNTER — Ambulatory Visit (INDEPENDENT_AMBULATORY_CARE_PROVIDER_SITE_OTHER): Payer: Medicaid Other

## 2021-06-09 VITALS — BP 101/61 | HR 86 | Wt 132.0 lb

## 2021-06-09 DIAGNOSIS — Z3480 Encounter for supervision of other normal pregnancy, unspecified trimester: Secondary | ICD-10-CM

## 2021-06-09 DIAGNOSIS — Z3A37 37 weeks gestation of pregnancy: Secondary | ICD-10-CM

## 2021-06-09 NOTE — Progress Notes (Signed)
? ?  LOW-RISK PREGNANCY OFFICE VISIT ? ?Patient name: Brianna Thornton MRN [id number]  Date of birth: [birth year] ?Chief Complaint:   ?Routine Prenatal Visit ? ?Subjective:   ?[name] is a 21 y.o. G44P0020 female at [redacted]w[redacted]d with an Estimated Date of Delivery: 06/28/21 being seen today for ongoing management of a low-risk pregnancy aeb has Seizure-like activity (HCC); Episodic tension-type headache, not intractable; Encounter for supervision of normal pregnancy, antepartum; and History of COVID-19 on their problem list. ? ?Patient presents today,alone, with no complaints.  Patient endorses fetal movement. Patient reports occasional abdominal cramping or contractions.  Patient denies vaginal concerns including abnormal discharge, leaking of fluid, and bleeding. No issues with urination, constipation, or diarrhea.  ? Contractions: Not present. Vag. Bleeding: None.  Movement: Present. ? ?Reviewed past medical,surgical, social, obstetrical and family history as well as problem list, medications and allergies. ? ?Objective  ? ?Vitals:  ? 06/09/21 1001  ?BP: 101/61  ?Pulse: 86  ?Weight: 132 lb (59.9 kg)  ?Body mass index is 24.54 kg/m?.  ?Total Weight Gain:49 lb (22.2 kg) ? ?  ?     Physical Examination:  ? General appearance: Well appearing, and in no distress ? Mental status: Alert, oriented to person, place, and time ? Skin: Warm & dry ? Cardiovascular: Normal heart rate noted ? Respiratory: Normal respiratory effort, no distress ? Abdomen: Soft, gravid, nontender, AGA with Fundal Height: 37 cm ? Pelvic: Cervical exam deferred          ? Extremities: Edema: None ? ?Fetal Status: Fetal Heart Rate (bpm): 149  Movement: Present  ? ?No results found for this or any previous visit (from the past 24 hour(s)).  ?Assessment & Plan:  ?Low-risk pregnancy of a 21 y.o., G3P0020 at [redacted]w[redacted]d with an Estimated Date of Delivery: 06/28/21  ? ?1. Supervision of other normal pregnancy, antepartum ?-Anticipatory guidance for upcoming appts. ?-Patient to  schedule next appt in 1 weeks for an in-person visit.. ?-Discussed and reviewed postpartum planning including contraception, pediatricians, and infant feedings and circumcision (desired). ?*Patient desires to breastfeed and is considering Depo Provera for contraception. ? ? ?2. [redacted] weeks gestation of pregnancy ?-Doing well. ?-Extensive discussion regarding history of seizure like activity. ?-Informed that this provider without concerns and per sticky note all other midwives agreeable. ?-Discussed risks and reported benefits of WB as well as exclusion criteria that could occur at this junction of pregnancy including: ?*Tub availability ?*New onset gHTN ?*NRFHT ?*Vaginal Bleeding ?*Positive Covid ?*Midwife availability and/or discretion ?-Patient verbalizes understanding and without questions. ?-Consents signed. ?-Patient given list of risks/benefits for her records. ? ? ?  ?Meds: No orders of the defined types were placed in this encounter. ? ?Labs/procedures today:  ?Lab Orders  ?No laboratory test(s) ordered today  ?  ? ?Reviewed: Term labor symptoms and general obstetric precautions including but not limited to vaginal bleeding, contractions, leaking of fluid and fetal movement were reviewed in detail with the patient.  All questions were answered. ? ?Follow-up: Return in about 1 week (around 06/16/2021) for LROB. ? ?No orders of the defined types were placed in this encounter. ? ?Cherre Robins MSN, CNM ?06/09/2021 ? ?

## 2021-06-14 ENCOUNTER — Other Ambulatory Visit: Payer: Self-pay

## 2021-06-14 ENCOUNTER — Encounter: Payer: Self-pay | Admitting: Pediatrics

## 2021-06-14 ENCOUNTER — Ambulatory Visit (INDEPENDENT_AMBULATORY_CARE_PROVIDER_SITE_OTHER): Payer: Medicaid Other | Admitting: Obstetrics

## 2021-06-14 ENCOUNTER — Encounter: Payer: Self-pay | Admitting: Obstetrics

## 2021-06-14 ENCOUNTER — Ambulatory Visit (INDEPENDENT_AMBULATORY_CARE_PROVIDER_SITE_OTHER): Payer: Self-pay | Admitting: Pediatrics

## 2021-06-14 VITALS — BP 105/64 | HR 79 | Wt 132.1 lb

## 2021-06-14 DIAGNOSIS — Z7681 Expectant parent(s) prebirth pediatrician visit: Secondary | ICD-10-CM | POA: Insufficient documentation

## 2021-06-14 DIAGNOSIS — Z3480 Encounter for supervision of other normal pregnancy, unspecified trimester: Secondary | ICD-10-CM

## 2021-06-14 NOTE — Progress Notes (Signed)
Prenatal counseling for impending newborn done. Mom agrees with vaccine policy. ?Z76.81 ? ?

## 2021-06-14 NOTE — Progress Notes (Signed)
Pt in offic-e for ROB visit. GBS cilture collected 05-31-21. Wet prep collect 05-17-21. She does not have any concerns today.  ?

## 2021-06-14 NOTE — Progress Notes (Signed)
Subjective:  ?Brianna Thornton is a 21 y.o. G3P0020 at 105w0d being seen today for ongoing prenatal care.  She is currently monitored for the following issues for this low-risk pregnancy and has Seizure-like activity ([place]); Episodic tension-type headache, not intractable; Encounter for supervision of normal pregnancy, antepartum; History of COVID-19; and Pediatric pre-birth visit for expectant parent on their problem list. ? ?Patient reports no complaints.  Contractions: Irritability. Vag. Bleeding: None.  Movement: Present. Denies leaking of fluid.  ? ?The following portions of the patient's history were reviewed and updated as appropriate: allergies, current medications, past family history, past medical history, past social history, past surgical history and problem list. Problem list updated. ? ?Objective:  ? ?Vitals:  ? 06/14/21 1359  ?BP: 105/64  ?Pulse: 79  ?Weight: 132 lb 1.6 oz (59.9 kg)  ? ? ?Fetal Status:     Movement: Present    ? ?General:  Alert, oriented and cooperative. Patient is in no acute distress.  ?Skin: Skin is warm and dry. No rash noted.   ?Cardiovascular: Normal heart rate noted  ?Respiratory: Normal respiratory effort, no problems with respiration noted  ?Abdomen: Soft, gravid, appropriate for gestational age. Pain/Pressure: Present     ?Pelvic:  Cervical exam deferred        ?Extremities: Normal range of motion.  Edema: None  ?Mental Status: Normal mood and affect. Normal behavior. Normal judgment and thought content.  ? ?Urinalysis:     ? ?Assessment and Plan:  ?Pregnancy: G3P0020 at [redacted]w[redacted]d ? ?1. Supervision of other normal pregnancy, antepartum ? ?2. GBS negative ?  ?There are no diagnoses linked to this encounter. ?Term labor symptoms and general obstetric precautions including but not limited to vaginal bleeding, contractions, leaking of fluid and fetal movement were reviewed in detail with the patient. ?Please refer to After Visit Summary for other counseling recommendations. ?  ?Return in  about 1 week (around 06/21/2021) for [place] with midwife to discuss [place]. ? ? ?Shelly Bombard, MD  ?06/14/21  ?

## 2021-06-16 ENCOUNTER — Inpatient Hospital Stay (HOSPITAL_COMMUNITY)
Admission: AD | Admit: 2021-06-16 | Discharge: 2021-06-16 | Disposition: A | Discharge status: Home / Self Care | Payer: Medicaid Other | Attending: Obstetrics & Gynecology | Admitting: Obstetrics & Gynecology

## 2021-06-16 ENCOUNTER — Other Ambulatory Visit: Payer: Self-pay

## 2021-06-16 DIAGNOSIS — Z3A38 38 weeks gestation of pregnancy: Secondary | ICD-10-CM

## 2021-06-16 DIAGNOSIS — R609 Edema, unspecified: Secondary | ICD-10-CM

## 2021-06-16 DIAGNOSIS — O26893 Other specified pregnancy related conditions, third trimester: Secondary | ICD-10-CM | POA: Insufficient documentation

## 2021-06-16 DIAGNOSIS — O1203 Gestational edema, third trimester: Secondary | ICD-10-CM | POA: Diagnosis not present

## 2021-06-16 NOTE — MAU Note (Signed)
Brianna Thornton is a 21 y.o. at [redacted]w[redacted]d here in MAU reporting: today has noticed increased swelling in both feet, states left was more then her right. No bleeding or LOF. +FM. No pain currently. ? ?Onset of complaint: today ? ?Pain score: 0/10 ?Vitals:  ? 06/16/21 1724  ?BP: (!) 114/58  ?Pulse: 90  ?Resp: 16  ?Temp: 98 ?F (36.7 ?C)  ?SpO2: 98%  ?   ?FHT:135 ? ?Lab orders placed from triage: none ? ?

## 2021-06-16 NOTE — MAU Provider Note (Signed)
Event Date/Time  ?First Provider Initiated Contact with Patient 06/16/21 1750   ?  ?S ?Ms. [name] is a 21 y.o. G3P0020 patient who presents to MAU today with complaint of swelling in her toes and ankles. Patient states she has been "walking around barefoot" and relaxing today. She denies injury or pain. She denies calf swelling, calf pain, discoloration. She is not experiencing SOB, activity intolerance, chest pain or palpitations.  ? ?O ?BP (!) 114/58 (BP Location: Right Arm)   Pulse 90   Temp 98 ?F (36.7 ?C) (Oral)   Resp 16   Ht 5' 1.5" (1.562 m)   Wt 60.3 kg   LMP 09/21/2020   SpO2 98% Comment: room air  BMI 24.70 kg/m?   ? ?Physical Exam ?Vitals and nursing note reviewed. Exam conducted with a chaperone present.  ?Constitutional:   ?   Appearance: Normal appearance.  ?Cardiovascular:  ?   Rate and Rhythm: Normal rate.  ?   Pulses: Normal pulses.  ?Pulmonary:  ?   Effort: Pulmonary effort is normal.  ?Abdominal:  ?   Comments: Gravid  ?Skin: ?   Capillary Refill: Capillary refill takes less than 2 seconds.  ?Neurological:  ?   Mental Status: She is alert and oriented to person, place, and time.  ?Psychiatric:     ?   Mood and Affect: Mood normal.     ?   Behavior: Behavior normal.     ?   Thought Content: Thought content normal.     ?   Judgment: Judgment normal.  ? ? ?A ?Medical screening exam complete ?FHT 135 by Doppler ?Scant dependent edema at ankles and feet ?No edema superior to ankles ?Discussed risk factors, typical symptoms associated with DVT ?Reviewed Wells Criteria for likelihood of DVT ?Advised compression stockings, elevated feet to level of hips ? ?P ?Discharge from MAU in stable condition ? ? ?Clayton Bibles, MSA, MSN, CNM ?06/16/2021 7:12 PM  ? ?

## 2021-06-19 ENCOUNTER — Encounter: Payer: Medicaid Other | Admitting: Advanced Practice Midwife

## 2021-06-20 ENCOUNTER — Ambulatory Visit (INDEPENDENT_AMBULATORY_CARE_PROVIDER_SITE_OTHER): Payer: Medicaid Other | Admitting: Advanced Practice Midwife

## 2021-06-20 ENCOUNTER — Encounter: Payer: Self-pay | Admitting: Advanced Practice Midwife

## 2021-06-20 ENCOUNTER — Other Ambulatory Visit: Payer: Self-pay

## 2021-06-20 VITALS — BP 117/71 | HR 89 | Wt 131.8 lb

## 2021-06-20 DIAGNOSIS — R609 Edema, unspecified: Secondary | ICD-10-CM

## 2021-06-20 DIAGNOSIS — Z3A38 38 weeks gestation of pregnancy: Secondary | ICD-10-CM

## 2021-06-20 DIAGNOSIS — Z3480 Encounter for supervision of other normal pregnancy, unspecified trimester: Secondary | ICD-10-CM

## 2021-06-20 NOTE — Progress Notes (Signed)
? ?  PRENATAL VISIT NOTE ? ?Subjective:  ?Brianna Thornton is a 21 y.o. G3P0020 at [redacted]w[redacted]d being seen today for ongoing prenatal care.  She is currently monitored for the following issues for this low-risk pregnancy and has Seizure-like activity (HCC); Episodic tension-type headache, not intractable; Encounter for supervision of normal pregnancy, antepartum; History of COVID-19; and Pediatric pre-birth visit for expectant parent on their problem list. ? ?Patient reports occasional contractions.  Contractions: Irritability. Vag. Bleeding: None.  Movement: Present. Denies leaking of fluid.  ? ?The following portions of the patient's history were reviewed and updated as appropriate: allergies, current medications, past family history, past medical history, past social history, past surgical history and problem list.  ? ?Objective:  ? ?Vitals:  ? 06/20/21 1338  ?BP: 117/71  ?Pulse: 89  ?Weight: 131 lb 12.8 oz (59.8 kg)  ? ? ?Fetal Status: Fetal Heart Rate (bpm): 133 Fundal Height: 38 cm Movement: Present  Presentation: Vertex ? ?General:  Alert, oriented and cooperative. Patient is in no acute distress.  ?Skin: Skin is warm and dry. No rash noted.   ?Cardiovascular: Normal heart rate noted  ?Respiratory: Normal respiratory effort, no problems with respiration noted  ?Abdomen: Soft, gravid, appropriate for gestational age.  Pain/Pressure: Present     ?Pelvic: Cervical exam performed in the presence of a chaperone Dilation: 1 Effacement (%): 40 Station: -3  ?Extremities: Normal range of motion.  Edema: None  ?Mental Status: Normal mood and affect. Normal behavior. Normal judgment and thought content.  ? ?Assessment and Plan:  ?Pregnancy: G3P0020 at [redacted]w[redacted]d ?1. Supervision of other normal pregnancy, antepartum ?--Anticipatory guidance about next visits/weeks of pregnancy given.  ?--Reviewed labor readiness with patient including the [employer] [facility] [employer], evening primrose oil, and raspberry leaf tea.   ?--Pt asked about IOL. Cervix not  favorable today, firm/medium consistency 1 cm and not well effaced.   ?--Pt to try labor readiness [employer], EPO, continue drinking raspberry tea) and follow up next week. If no labor, consider elective IOL if desired. ?--Discussed how IOL may affect pt plans for waterbirth and she states understanding. ?  ?2. Dependent edema ?--mild edema today, BP grossly normal ?3. [redacted] weeks gestation of pregnancy ? ? ?Term labor symptoms and general obstetric precautions including but not limited to vaginal bleeding, contractions, leaking of fluid and fetal movement were reviewed in detail with the patient. ?Please refer to After Visit Summary for other counseling recommendations.  ? ?Return in about 1 week (around 06/27/2021) for LOB, Midwife preferred. ? ?Future Appointments  ?Date Time Provider Department Center  ?06/27/2021  2:30 PM Adam Phenix, MD [facility] None  ? ? ?Sharen Counter, CNM  ?

## 2021-06-20 NOTE — Patient Instructions (Signed)
Things to Try After 37 weeks to Encourage Labor/Get Ready for Labor:    Try the Miles Circuit at www.milescircuit.com daily to improve baby's position and encourage the onset of labor.  Walk a little and rest a little every day.  Change positions often.  Cervical Ripening: May try one or both Red Raspberry Leaf capsules or tea:  two 300mg or 400mg tablets with each meal, 2-3 times a day, or 1-3 cups of tea daily  Potential Side Effects Of Raspberry Leaf:  Most women do not experience any side effects from drinking raspberry leaf tea. However, nausea and loose stools are possible   Evening Primrose Oil capsules: take 1 capsule by mouth and place one capsule in the vagina every night.    Some of the potential side effects:  Upset stomach  Loose stools or diarrhea  Headaches  Nausea  Sex can also help the cervix ripen and encourage labor onset.    Labor Precautions Reasons to come to [facility] at [facility]:  1.  Contractions are  5 minutes apart or less, each last 1 minute, these have been going on for 1-2 hours, and you cannot walk or talk during them 2.  You have a large gush of fluid, or a trickle of fluid that will not stop and you have to wear a pad 3.  You have bleeding that is bright red, heavier than spotting--like menstrual bleeding (spotting can be normal in early labor or after a check of your cervix) 4.  You do not feel the baby moving like he/she normally does  

## 2021-06-24 ENCOUNTER — Inpatient Hospital Stay (HOSPITAL_COMMUNITY): Admit: 2021-06-24 | Payer: Medicaid Other | Admitting: Obstetrics and Gynecology

## 2021-06-27 ENCOUNTER — Ambulatory Visit (INDEPENDENT_AMBULATORY_CARE_PROVIDER_SITE_OTHER): Payer: Medicaid Other | Admitting: Obstetrics & Gynecology

## 2021-06-27 VITALS — BP 101/66 | HR 87 | Wt 132.0 lb

## 2021-06-27 DIAGNOSIS — Z3480 Encounter for supervision of other normal pregnancy, unspecified trimester: Secondary | ICD-10-CM

## 2021-06-27 DIAGNOSIS — G44219 Episodic tension-type headache, not intractable: Secondary | ICD-10-CM

## 2021-06-27 NOTE — Progress Notes (Signed)
? ?  PRENATAL VISIT NOTE ? ?Subjective:  ?Brianna Thornton is a 21 y.o. G3P0020 at [redacted]w[redacted]d being seen today for ongoing prenatal care.  She is currently monitored for the following issues for this low-risk pregnancy and has Seizure-like activity ([place]); Episodic tension-type headache, not intractable; Encounter for supervision of normal pregnancy, antepartum; History of COVID-19; and Pediatric pre-birth visit for expectant parent on their problem list. ? ?Patient reports occasional contractions.  Contractions: Not present. Vag. Bleeding: None.  Movement: Present. Denies leaking of fluid.  ? ?The following portions of the patient's history were reviewed and updated as appropriate: allergies, current medications, past family history, past medical history, past social history, past surgical history and problem list.  ? ?Objective:  ? ?Vitals:  ? 06/27/21 1448  ?BP: 101/66  ?Pulse: 87  ?Weight: 132 lb (59.9 kg)  ? ? ?Fetal Status: Fetal Heart Rate (bpm): 131   Movement: Present  Presentation: Vertex ? ?General:  Alert, oriented and cooperative. Patient is in no acute distress.  ?Skin: Skin is warm and dry. No rash noted.   ?Cardiovascular: Normal heart rate noted  ?Respiratory: Normal respiratory effort, no problems with respiration noted  ?Abdomen: Soft, gravid, appropriate for gestational age.  Pain/Pressure: Present     ?Pelvic: Cervical exam performed in the presence of a chaperone Dilation: 1 Effacement (%): 40 Station: -3  ?Extremities: Normal range of motion.  Edema: None  ?Mental Status: Normal mood and affect. Normal behavior. Normal judgment and thought content.  ? ?Assessment and Plan:  ?Pregnancy: G3P0020 at [redacted]w[redacted]d ?1. Supervision of other normal pregnancy, antepartum ?IOL 41 weeks scheduled ? ?2. Episodic tension-type headache, not intractable ? ? ?Term labor symptoms and general obstetric precautions including but not limited to vaginal bleeding, contractions, leaking of fluid and fetal movement were reviewed in  detail with the patient. ?Please refer to After Visit Summary for other counseling recommendations.  ? ?Return in about 6 days (around 07/03/2021). ? ?No future appointments. ? ?Emeterio Reeve, MD ?

## 2021-06-28 ENCOUNTER — Other Ambulatory Visit: Payer: Self-pay | Admitting: Advanced Practice Midwife

## 2021-06-29 ENCOUNTER — Telehealth (HOSPITAL_COMMUNITY): Payer: Self-pay | Admitting: *Deleted

## 2021-06-29 ENCOUNTER — Encounter (HOSPITAL_COMMUNITY): Payer: Self-pay | Admitting: *Deleted

## 2021-06-29 NOTE — Telephone Encounter (Signed)
Preadmission screen  

## 2021-07-02 ENCOUNTER — Other Ambulatory Visit: Payer: Self-pay | Admitting: Family Medicine

## 2021-07-03 ENCOUNTER — Ambulatory Visit (INDEPENDENT_AMBULATORY_CARE_PROVIDER_SITE_OTHER): Payer: Medicaid Other | Admitting: Obstetrics and Gynecology

## 2021-07-03 ENCOUNTER — Encounter: Payer: Self-pay | Admitting: Obstetrics and Gynecology

## 2021-07-03 DIAGNOSIS — Z3480 Encounter for supervision of other normal pregnancy, unspecified trimester: Secondary | ICD-10-CM | POA: Diagnosis not present

## 2021-07-03 NOTE — Patient Instructions (Signed)
Vaginal Delivery ?Vaginal delivery means that you give birth by pushing your baby out of your birth canal (vagina). Your health care team will help you before, during, and after vaginal delivery. ?Birth experiences are unique for every woman and every pregnancy, and birth experiences vary depending on where you choose to give birth. ?What are the risks and benefits? ?Generally, this is safe. However, problems may occur, including: ?Bleeding. ?Infection. ?Damage to other structures such as vaginal tearing. ?Allergic reactions to medicines. ?Despite the risks, benefits of vaginal delivery include less risk of bleeding and infection and a shorter recovery time compared to a Cesarean delivery. Cesarean delivery, or C-section, is the surgical delivery of a baby. ?What happens when I arrive at the birth center or hospital? ?Once you are in labor and have been admitted into the hospital or birth center, your health care team may: ?Review your pregnancy history and any concerns that you have. ?Talk with you about your birth plan and discuss pain control options. ?Check your blood pressure, breathing, and heartbeat. ?Assess your baby's heartbeat. ?Monitor your uterus for contractions. ?Check whether your bag of water (amniotic sac) has broken (ruptured). ?Insert an IV into one of your veins. This may be used to give you fluids and medicines. ?Monitoring ?Your health care team may assess your contractions (uterine monitoring) and your baby's heart rate (fetal monitoring). You may need to be monitored: ?Often, but not continuously (intermittently). ?All the time or for long periods at a time (continuously). Continuous monitoring may be needed if: ?You are taking certain medicines, such as medicine to relieve pain or make your contractions stronger. ?You have pregnancy or labor complications. ?Monitoring may be done by: ?Placing a special stethoscope or a handheld monitoring device on your abdomen to check your baby's heartbeat  and to check for contractions. ?Placing monitors on your abdomen (external monitors) to record your baby's heartbeat and the frequency and length of contractions. ?Placing monitors inside your uterus through your vagina (internal monitors) to record your baby's heartbeat and the frequency, length, and strength of your contractions. Depending on the type of monitor, it may remain in your uterus or on your baby's head until birth. ?Telemetry. This is a type of continuous monitoring that can be done with external or internal monitors. Instead of having to stay in bed, you are able to move around. ?Physical exam ?Your health care team may perform frequent physical exams. This may include: ?Checking how and where your baby is positioned in your uterus. ?Checking your cervix to determine: ?Whether it is thinning out (effacing). ?Whether it is opening up (dilating). ?What happens during labor and delivery? ?Normal labor and delivery is divided into the following three stages: ?Stage 1 ?This is the longest stage of labor. ?Throughout this stage, you will feel contractions. Contractions generally feel mild, infrequent, and irregular at first. They get stronger, more frequent, and more regular as you move through this stage. You may have contractions about every 2-3 minutes. ?This stage ends when your cervix is completely dilated to 4 inches (10 cm) and completely effaced. ?Stage 2 ?This stage starts once your cervix is completely effaced and dilated and lasts until the delivery of your baby. ?This is the stage where you will feel an urge to push your baby out of your vagina. ?You may feel stretching and burning pain, especially when the widest part of your baby's head passes through the vaginal opening (crowning). ?Once your baby is delivered, the umbilical cord will be clamped and   cut. Timing of cutting the cord will depend on your wishes, your baby's health, and your health care provider's practices. ?Your baby will be  placed on your bare chest (skin-to-skin contact) in an upright position and covered with a warm blanket. If you are choosing to breastfeed, watch your baby for feeding cues, like rooting or sucking, and help the baby to your breast for his or her first feeding. ?Stage 3 ?This stage starts immediately after the birth of your baby and ends after you deliver the placenta. ?This stage may take anywhere from 5 to 30 minutes. ?After your baby has been delivered, you will feel contractions as your body expels the placenta. These contractions also help your uterus get smaller and reduce bleeding. ?What can I expect after labor and delivery? ?After labor is over, you and your baby will be assessed closely until you are ready to go home. Your health care team will teach you how to care for yourself and your baby. ?You and your baby may be encouraged to stay in the same room (rooming in) during your hospital stay. This will help promote early bonding and successful breastfeeding. ?Your uterus will be checked and massaged regularly (fundal massage). ?You may continue to receive fluids and medicines through an IV. ?You will have some soreness and pain in your abdomen, vagina, and the area of skin between your vaginal opening and your anus (perineum). ?If an incision was made near your vagina (episiotomy) or if you had some vaginal tearing during delivery, cold compresses may be placed on your episiotomy or your tear. This helps to reduce pain and swelling. ?It is normal to have vaginal bleeding after delivery. Wear a sanitary pad for vaginal bleeding and discharge. ?Summary ?Vaginal delivery means that you will give birth by pushing your baby out of your birth canal (vagina). ?Your health care team will monitor you and your baby throughout the stages of labor. ?After you deliver your baby, your health care team will continue to assess you and your baby to ensure you are both recovering as expected after delivery. ?This  information is not intended to replace advice given to you by your health care provider. Make sure you discuss any questions you have with your health care provider. ?Document Revised: 02/08/2020 Document Reviewed: 02/08/2020 ?Elsevier Patient Education ? 2022 [employer] ? ?

## 2021-07-03 NOTE — Progress Notes (Signed)
Pt reports fetal movement with some pressure. 

## 2021-07-03 NOTE — Progress Notes (Signed)
Subjective:  ?Brianna Thornton is a 21 y.o. G3P0020 at [redacted]w[redacted]d being seen today for ongoing prenatal care.  She is currently monitored for the following issues for this low-risk pregnancy and has Seizure-like activity ([facility]); Episodic tension-type headache, not intractable; Encounter for supervision of normal pregnancy, antepartum; History of COVID-19; and Pediatric pre-birth visit for expectant parent on their problem list. ? ?Patient reports general discomforts of pregnancy.  Contractions: Not present. Vag. Bleeding: None.  Movement: Present. Denies leaking of fluid.  ? ?The following portions of the patient's history were reviewed and updated as appropriate: allergies, current medications, past family history, past medical history, past social history, past surgical history and problem list. Problem list updated. ? ?Objective:  ? ?Vitals:  ? 07/03/21 1026  ?BP: 114/76  ?Pulse: 86  ?Weight: 138 lb (62.6 kg)  ? ? ?Fetal Status: Fetal Heart Rate (bpm): NST   Movement: Present    ? ?General:  Alert, oriented and cooperative. Patient is in no acute distress.  ?Skin: Skin is warm and dry. No rash noted.   ?Cardiovascular: Normal heart rate noted  ?Respiratory: Normal respiratory effort, no problems with respiration noted  ?Abdomen: Soft, gravid, appropriate for gestational age. Pain/Pressure: Present     ?Pelvic:  Cervical exam deferred        ?Extremities: Normal range of motion.  Edema: None  ?Mental Status: Normal mood and affect. Normal behavior. Normal judgment and thought content.  ? ?Urinalysis:     ? ?Assessment and Plan:  ?Pregnancy: G3P0020 at [redacted]w[redacted]d ? ?1. Supervision of other normal pregnancy, antepartum ?Labor precautions ? ?NST performed today was reviewed and was found to be reactive.   ?IOL scheduled for Wednesday ?Pt declined out pt foley ? ?Term labor symptoms and general obstetric precautions including but not limited to vaginal bleeding, contractions, leaking of fluid and fetal movement were reviewed in  detail with the patient. ?Please refer to After Visit Summary for other counseling recommendations.  ?No follow-ups on file. ? ? ?Hermina Staggers, MD ?

## 2021-07-05 ENCOUNTER — Inpatient Hospital Stay (HOSPITAL_COMMUNITY)
Admission: AD | Admit: 2021-07-05 | Discharge: 2021-07-08 | DRG: 807 | Disposition: A | Discharge status: Home / Self Care | Payer: Medicaid Other | Attending: Obstetrics & Gynecology | Admitting: Obstetrics & Gynecology

## 2021-07-05 ENCOUNTER — Inpatient Hospital Stay (HOSPITAL_COMMUNITY): Payer: Medicaid Other

## 2021-07-05 DIAGNOSIS — O26893 Other specified pregnancy related conditions, third trimester: Secondary | ICD-10-CM | POA: Diagnosis present

## 2021-07-05 DIAGNOSIS — Z8616 Personal history of COVID-19: Secondary | ICD-10-CM | POA: Diagnosis not present

## 2021-07-05 DIAGNOSIS — R030 Elevated blood-pressure reading, without diagnosis of hypertension: Secondary | ICD-10-CM | POA: Diagnosis present

## 2021-07-05 DIAGNOSIS — R569 Unspecified convulsions: Secondary | ICD-10-CM

## 2021-07-05 DIAGNOSIS — Z3490 Encounter for supervision of normal pregnancy, unspecified, unspecified trimester: Secondary | ICD-10-CM

## 2021-07-05 DIAGNOSIS — Z3A41 41 weeks gestation of pregnancy: Secondary | ICD-10-CM

## 2021-07-05 DIAGNOSIS — O480 Post-term pregnancy: Principal | ICD-10-CM | POA: Diagnosis present

## 2021-07-05 LAB — COMPREHENSIVE METABOLIC PANEL
ALT: 9 U/L (ref 0–44)
AST: 17 U/L (ref 15–41)
Albumin: 3 g/dL — ABNORMAL LOW (ref 3.5–5.0)
Alkaline Phosphatase: 172 U/L — ABNORMAL HIGH (ref 38–126)
Anion gap: 8 (ref 5–15)
BUN: <5 mg/dL — ABNORMAL LOW (ref 6–20)
CO2: 22 mmol/L (ref 22–32)
Calcium: 8.9 mg/dL (ref 8.9–10.3)
Chloride: 108 mmol/L (ref 98–111)
Creatinine, Ser: 0.7 mg/dL (ref 0.44–1.00)
GFR, Estimated: >60 mL/min (ref >60)
Glucose, Bld: 104 mg/dL — ABNORMAL HIGH (ref 70–99)
Potassium: 3.7 mmol/L (ref 3.5–5.1)
Sodium: 138 mmol/L (ref 135–145)
Total Bilirubin: 0.4 mg/dL (ref 0.3–1.2)
Total Protein: 6.2 g/dL — ABNORMAL LOW (ref 6.5–8.1)

## 2021-07-05 LAB — RAPID HIV SCREEN (HIV 1/2 AB+AG)
HIV 1/2 Antibodies: NONREACTIVE
HIV-1 P24 Antigen - HIV24: NONREACTIVE

## 2021-07-05 LAB — PROTEIN / CREATININE RATIO, URINE
Creatinine, Urine: 135.16 mg/dL
Protein Creatinine Ratio: 0.1 mg/mg{Cre} (ref 0.00–0.15)
Total Protein, Urine: 14 mg/dL

## 2021-07-05 LAB — CBC
HCT: 32.4 % — ABNORMAL LOW (ref 36.0–46.0)
Hemoglobin: 10.7 g/dL — ABNORMAL LOW (ref 12.0–15.0)
MCH: 28 pg (ref 26.0–34.0)
MCHC: 33 g/dL (ref 30.0–36.0)
MCV: 84.8 fL (ref 80.0–100.0)
Platelets: 320 10*3/uL (ref 150–400)
RBC: 3.82 MIL/uL — ABNORMAL LOW (ref 3.87–5.11)
RDW: 14.3 % (ref 11.5–15.5)
WBC: 12.4 10*3/uL — ABNORMAL HIGH (ref 4.0–10.5)
nRBC: 0 % (ref 0.0–0.2)

## 2021-07-05 LAB — TYPE AND SCREEN
ABO/RH(D): O POS
Antibody Screen: NEGATIVE

## 2021-07-05 LAB — RPR: RPR Ser Ql: NONREACTIVE

## 2021-07-05 MED ORDER — TERBUTALINE SULFATE 1 MG/ML IJ SOLN: 0.2500 mg | Freq: Once | INTRAMUSCULAR | Status: DC | PRN

## 2021-07-05 MED ORDER — SOD CITRATE-CITRIC ACID 500-334 MG/5ML PO SOLN
30.0000 mL | ORAL | Status: DC | PRN
Start: 2021-07-05 — End: 2021-07-06

## 2021-07-05 MED ORDER — OXYTOCIN-SODIUM CHLORIDE 30-0.9 UT/500ML-% IV SOLN: 2.5000 [IU]/h | INTRAVENOUS | Status: DC

## 2021-07-05 MED ORDER — ONDANSETRON HCL 4 MG/2ML IJ SOLN: 4.0000 mg | Freq: Four times a day (QID) | INTRAMUSCULAR | Status: DC | PRN

## 2021-07-05 MED ORDER — PHENYLEPHRINE 40 MCG/ML (10ML) SYRINGE FOR IV PUSH (FOR BLOOD PRESSURE SUPPORT): 80.0000 ug | PREFILLED_SYRINGE | INTRAVENOUS | Status: DC | PRN

## 2021-07-05 MED ORDER — MISOPROSTOL 25 MCG QUARTER TABLET
25.0000 ug | ORAL_TABLET | ORAL | Status: DC | PRN
  Filled 2021-07-05: qty 1

## 2021-07-05 MED ORDER — LIDOCAINE HCL (PF) 1 % IJ SOLN: 30.0000 mL | INTRAMUSCULAR | Status: DC | PRN

## 2021-07-05 MED ORDER — PHENYLEPHRINE 40 MCG/ML (10ML) SYRINGE FOR IV PUSH (FOR BLOOD PRESSURE SUPPORT)
80.0000 ug | PREFILLED_SYRINGE | INTRAVENOUS | Status: DC | PRN
  Administered 2021-07-05: 80 ug via INTRAVENOUS
  Filled 2021-07-05: qty 10

## 2021-07-05 MED ORDER — EPHEDRINE 5 MG/ML INJ: 10.0000 mg | INTRAVENOUS | Status: DC | PRN

## 2021-07-05 MED ORDER — OXYTOCIN-SODIUM CHLORIDE 30-0.9 UT/500ML-% IV SOLN
1.0000 m[IU]/min | INTRAVENOUS | Status: DC
  Administered 2021-07-05: 2 m[IU]/min via INTRAVENOUS
  Filled 2021-07-05: qty 500

## 2021-07-05 MED ORDER — FENTANYL-BUPIVACAINE-NACL 0.5-0.125-0.9 MG/250ML-% EP SOLN
EPIDURAL | Status: DC | PRN
  Administered 2021-07-05: 12 mL/h via EPIDURAL

## 2021-07-05 MED ORDER — OXYCODONE-ACETAMINOPHEN 5-325 MG PO TABS: 2.0000 | ORAL_TABLET | ORAL | Status: DC | PRN

## 2021-07-05 MED ORDER — OXYTOCIN BOLUS FROM INFUSION
333.0000 mL | Freq: Once | INTRAVENOUS | Status: AC
  Administered 2021-07-06: 333 mL via INTRAVENOUS

## 2021-07-05 MED ORDER — LACTATED RINGERS IV SOLN
INTRAVENOUS | Status: DC
Start: 2021-07-05 — End: 2021-07-06

## 2021-07-05 MED ORDER — LACTATED RINGERS IV SOLN: 500.0000 mL | Freq: Once | INTRAVENOUS | Status: DC

## 2021-07-05 MED ORDER — LIDOCAINE HCL (PF) 1 % IJ SOLN
INTRAMUSCULAR | Status: DC | PRN
  Administered 2021-07-05: 5 mL via EPIDURAL

## 2021-07-05 MED ORDER — FENTANYL-BUPIVACAINE-NACL 0.5-0.125-0.9 MG/250ML-% EP SOLN
12.0000 mL/h | EPIDURAL | Status: DC | PRN
  Filled 2021-07-05: qty 250

## 2021-07-05 MED ORDER — LACTATED RINGERS IV SOLN
500.0000 mL | INTRAVENOUS | Status: DC | PRN
  Administered 2021-07-05: 1000 mL via INTRAVENOUS

## 2021-07-05 MED ORDER — MISOPROSTOL 50MCG HALF TABLET: 50.0000 ug | ORAL_TABLET | ORAL | Status: DC

## 2021-07-05 MED ORDER — ACETAMINOPHEN 325 MG PO TABS: 650.0000 mg | ORAL_TABLET | ORAL | Status: DC | PRN

## 2021-07-05 MED ORDER — MISOPROSTOL 50MCG HALF TABLET
50.0000 ug | ORAL_TABLET | ORAL | Status: DC | PRN
  Administered 2021-07-05 (×3): 50 ug via BUCCAL
  Filled 2021-07-05 (×3): qty 1

## 2021-07-05 MED ORDER — DIPHENHYDRAMINE HCL 50 MG/ML IJ SOLN: 12.5000 mg | INTRAMUSCULAR | Status: DC | PRN

## 2021-07-05 MED ORDER — OXYCODONE-ACETAMINOPHEN 5-325 MG PO TABS: 1.0000 | ORAL_TABLET | ORAL | Status: DC | PRN

## 2021-07-05 NOTE — Progress Notes (Addendum)
Labor Progress Note ?Brianna Thornton is a 21 y.o. G3P0020 at [redacted]w[redacted]d presented for IOL for post dates ? ?S: Notified by nursing that patients foley balloon came out at around 0900.  Patient doing well, utilizing nitrous gas for pain at the time of interview.  She mentions that her pain is a 10 with contractions and about a 3 in between.   ? ?O:  ?BP 121/71 (BP Location: Left Arm)   Pulse 90   Temp (!) 96.9 ?F (36.1 ?C) (Axillary)   Resp 18   LMP 09/21/2020  ?EFM: 145 baseline/moderate variability/accels present  ? ?CVE: Dilation: 3 ?Effacement (%): Thick ?Cervical Position: Posterior ?Station: -3 ?Presentation: Vertex ?Exam by:: Luna Kitchens, CNM ? ? ?A&P: 21 y.o. G3P0020 [redacted]w[redacted]d  ? ?#Labor: Progressing well. Foley balloon came out at about 0900 with mild cervical change to 3, interested in a water birth  ?#Pain: PRN, utilizing nitrous gas at this time  ?#FWB: Cat 1  ?#GBS negative ? ?#remote history of seizures  ?-- See H&P for full neuro documentation  ?-- normal neuro workup in 2019 ? ?#elevated blood pressure x 1  ?-- asymptomatic  ?-- labs WNLs ? ?Derenda Fennel, DO, PGY-1 ?11:09 AM  ? ?Attestation of Supervision of Student:  I confirm that I have verified the information documented in the  resident  student?s note and that I have also personally reperformed the history, physical exam and all medical decision making activities.  I have verified that all services and findings are accurately documented in this student's note; and I agree with management and plan as outlined in the documentation. I have also made any necessary editorial changes. ? ? ? ?Marylene Land, CNM ?[facility] for [employer], [facility] ?07/05/2021 12:38 PM ? ?

## 2021-07-05 NOTE — Anesthesia Procedure Notes (Signed)
Epidural ?Patient location during procedure: OB ?Start time: 07/05/2021 8:25 PM ?End time: 07/05/2021 8:40 PM ? ?Staffing ?Anesthesiologist: Barnet Glasgow, MD ?Performed: anesthesiologist  ? ?Preanesthetic Checklist ?Completed: patient identified, IV checked, site marked, risks and benefits discussed, surgical consent, monitors and equipment checked, pre-op evaluation and timeout performed ? ?Epidural ?Patient position: sitting ?Prep: DuraPrep and site prepped and draped ?Patient monitoring: continuous pulse ox and blood pressure ?Approach: midline ?Location: L3-L4 ?Injection technique: LOR air ? ?Needle:  ?Needle type: Tuohy  ?Needle gauge: 17 G ?Needle length: 9 cm and 9 ?Needle insertion depth: 6 cm ?Catheter type: closed end flexible ?Catheter size: 19 Gauge ?Catheter at skin depth: 12 cm ?Test dose: negative ? ?Assessment ?Events: blood not aspirated, injection not painful, no injection resistance, no paresthesia and negative IV test ? ?Additional Notes ?Patient identified. Risks/Benefits/Options discussed with patient including but not limited to bleeding, infection, nerve damage, paralysis, failed block, incomplete pain control, headache, blood pressure changes, nausea, vomiting, reactions to medication both or allergic, itching and postpartum back pain. Confirmed with bedside nurse the patient's most recent platelet count. Confirmed with patient that they are not currently taking any anticoagulation, have any bleeding history or any family history of bleeding disorders. Patient expressed understanding and wished to proceed. All questions were answered. Sterile technique was used throughout the entire procedure. Please see nursing notes for vital signs. Test dose was given through epidural needle and negative prior to continuing to dose epidural or start infusion. Warning signs of high block given to the patient including shortness of breath, tingling/numbness in hands, complete motor block, or any  concerning symptoms with instructions to call for help. Patient was given instructions on fall risk and not to get out of bed. All questions and concerns addressed with instructions to call with any issues.  1 Attempt (S) . Patient tolerated procedure well. ? ? ? ?

## 2021-07-05 NOTE — Anesthesia Preprocedure Evaluation (Signed)
Anesthesia Evaluation  ?Patient identified by MRN, date of birth, ID band ?Patient awake ? ? ? ?Reviewed: ?Allergy & Precautions, NPO status , Patient's Chart, lab work & pertinent test results ? ?Airway ?Mallampati: II ? ?TM Distance: >3 FB ?Neck ROM: Full ? ? ? Dental ?no notable dental hx. ?(+) Teeth Intact, Dental Advisory Given ?  ?Pulmonary ?asthma ,  ?  ?Pulmonary exam normal ?breath sounds clear to auscultation ? ? ? ? ? ? Cardiovascular ?Exercise Tolerance: Good ?Normal cardiovascular exam ?Rhythm:Regular Rate:Normal ? ? ?  ?Neuro/Psych ? Headaches, PSYCHIATRIC DISORDERS Depression Bipolar Disorder   ? GI/Hepatic ?negative GI ROS, Neg liver ROS,   ?Endo/Other  ?negative endocrine ROS ? Renal/GU ?negative Renal ROS  ? ?  ?Musculoskeletal ? ? Abdominal ?  ?Peds ? Hematology ?Lab Results ?     Component                Value               Date                 ?     WBC                      12.4 (H)            07/05/2021           ?     HGB                      10.7 (L)            07/05/2021           ?     HCT                      32.4 (L)            07/05/2021           ?     MCV                      84.8                07/05/2021           ?     PLT                      320                 07/05/2021           ?   ?Anesthesia Other Findings ? ? Reproductive/Obstetrics ?(+) Pregnancy ? ?  ? ? ? ? ? ? ? ? ? ? ? ? ? ?  ?  ? ? ? ? ? ? ? ? ?Anesthesia Physical ?Anesthesia Plan ? ?ASA: 2 ? ?Anesthesia Plan: Epidural  ? ?Post-op Pain Management:   ? ?Induction:  ? ?PONV Risk Score and Plan:  ? ?Airway Management Planned:  ? ?Additional Equipment:  ? ?Intra-op Plan:  ? ?Post-operative Plan:  ? ?Informed Consent: I have reviewed the patients History and Physical, chart, labs and discussed the procedure including the risks, benefits and alternatives for the proposed anesthesia with the patient or authorized representative who has indicated his/her understanding and acceptance.   ? ? ? ? ? ?Plan Discussed with:  ? ?Anesthesia Plan Comments: (41wk G3P0 for LEA)  ? ? ? ? ? ? ?  Anesthesia Quick Evaluation ? ?

## 2021-07-05 NOTE — Progress Notes (Signed)
Labor Progress Note ?Brianna Thornton is a 21 y.o. G3P0020 at [redacted]w[redacted]d presented for IOL for post dates  ? ?S: Patient doing well, has been up and walking around, was able to get a shower and has been intermittently using nitrous during her contractions.  Her sister in law mentions some increased leakage of fluid since her last check at 1356 ? ?O:  ?BP 115/71   Pulse 78   Temp 98.1 ?F (36.7 ?C) (Oral)   Resp 18   LMP 09/21/2020  ?EFM: intermittent monitoring, no concerns from nursing  ? ?CVE: Dilation: 5 ?Effacement (%): 60 ?Cervical Position: Posterior ?Station: -2 ?Presentation: Vertex ?Exam by:: Lorn Junes, RNC ? ? ?A&P: 21 y.o. G3P0020 [redacted]w[redacted]d  ? ?#Labor: Progressing well.  ?#Pain: PRN, intermittent use of nitrous  ?#FWB: intermittent monitoring ?#GBS negative ? ?#remote history of seizures  ?-- See H&P for full neuro documentation  ?-- normal neuro workup in 2019 ?  ?#elevated blood pressure x 1  ?-- asymptomatic  ?-- labs WNLs ? ?Derenda Fennel, DO, PGY-1 ?3:55 PM  ?

## 2021-07-05 NOTE — Progress Notes (Signed)
Patient ID: Brianna Thornton, female   DOB: [birth year], 21 y.o.   MRN: [id number] ?Decided to get an epidural for pain, so is not doing a waterbirth ? ?Vitals:  ? 07/05/21 2130 07/05/21 2140 07/05/21 2144 07/05/21 2159  ?BP: (!) 76/44 (!) 92/49 (!) 104/54   ?Pulse: 85 83 63   ?Resp:      ?Temp:    98.7 ?F (37.1 ?C)  ?TempSrc:    Oral  ?SpO2:      ? ?FHR stable but has some intermittent decelerations, likely due to post-epidural hypotension ? ?UCs irregular ? ?Dilation: 4.5 ?Effacement (%): 80 ?Cervical Position: Posterior ?Station: -1 ?Presentation: Vertex ?Exam by:: E Chipps RN ? ?Will place [facility] next check  ?

## 2021-07-05 NOTE — H&P (Addendum)
OBSTETRIC ADMISSION HISTORY AND PHYSICAL ? ?Nene Guilbault is a 21 y.o. female G3P0020 with IUP at 15w0dby LMP presenting for IOL for post dates. She reports +FMs, No LOF, no VB, no blurry vision, headaches or peripheral edema, and RUQ pain.  She plans on breast and bottle feeding. She requests Depo for birth control. ?[facility]  ? ?Dating: By LMP and confirmed by early [place]--->  Estimated Date of Delivery: 06/28/21 ? ?Sono:   ?_0 , CWD, normal anatomy, cephalic presentation, right lateral placental lie, 1933g, 38% EFW ? ? ?Prenatal History/Complications:  ?Hx of seizure like activity? Per neuro visit in 2019, normal EEG ?Past Medical History: ?Past Medical History:  ?Diagnosis Date  ? Asthma   ? Depression   ? therapist said bipolar, pt does not want to take meds  ? Ovarian cyst   ? ? ?Past Surgical History: ?Past Surgical History:  ?Procedure Laterality Date  ? NO PAST SURGERIES    ? ? ?Obstetrical History: ?OB History   ? ? Gravida  ?3  ? Para  ?0  ? Term  ?   ? Preterm  ?   ? AB  ?2  ? Living  ?   ?  ? ? SAB  ?2  ? IAB  ?   ? Ectopic  ?   ? Multiple  ?   ? Live Births  ?   ?   ?  ?  ? ? ?Social History ?Social History  ? ?Socioeconomic History  ? Marital status: Single  ?  Spouse name: Not on file  ? Number of children: Not on file  ? Years of education: Not on file  ? Highest education level: Not on file  ?Occupational History  ? Not on file  ?Tobacco Use  ? Smoking status: Never  ? Smokeless tobacco: Never  ?Vaping Use  ? Vaping Use: Never used  ?Substance and Sexual Activity  ? Alcohol use: No  ? Drug use: Never  ? Sexual activity: Not Currently  ?  Birth control/protection: None  ?Other Topics Concern  ? Not on file  ?Social History Narrative  ? SAndrekais a 11th grade student.  ? She attends [employer]  ? She lives with her dad. She has older siblings.  ? She enjoys cheering, eating, and being on her phone.  ? ?Social Determinants of Health  ? ?Financial Resource  Strain: Not on file  ?Food Insecurity: Not on file  ?Transportation Needs: Not on file  ?Physical Activity: Not on file  ?Stress: Not on file  ?Social Connections: Not on file  ? ? ?Family History: ?Family History  ?Problem Relation Age of Onset  ? Bipolar disorder Mother   ? Healthy Father   ? ? ?Allergies: ?No Known Allergies ? ?Medications Prior to Admission  ?Medication Sig Dispense Refill Last Dose  ? famotidine (PEPCID) 20 MG tablet Take 1 tablet (20 mg total) by mouth 2 (two) times daily. 60 tablet 1 07/04/2021  ? Prenatal Vit-Fe Phos-FA-Omega (VITAFOL GUMMIES) 3.33-0.333-34.8 MG CHEW Chew 1 tablet by mouth daily. 90 tablet 11 07/04/2021  ? acetaminophen (TYLENOL) 500 MG tablet Take 2 tablets (1,000 mg total) by mouth every 6 (six) hours as needed (once). (Patient not taking: Reported on 05/15/2021) 30 tablet 0 Unknown  ? Blood Pressure Monitoring (BLOOD PRESSURE KIT) DEVI 1 Device by Does not apply route as needed. (Patient not taking: Reported on 05/31/2021) 1 each 0 Unknown  ? ? ? ?  Review of Systems  ? ?All systems reviewed and negative except as stated in HPI ? ?Blood pressure (!) 144/99, pulse (!) 128, resp. rate 16, last menstrual period 09/21/2020. ?General appearance: alert ?Lungs: clear to auscultation bilaterally ?Heart: regular rate and rhythm ?Abdomen: soft, non-tender; bowel sounds normal ?Extremities: Homans sign is negative, no sign of DVT ?Presentation: cephalic ?Fetal monitoringBaseline: 145 bpm, Variability: Good {> 6 bpm), Accelerations: Reactive, and Decelerations: Absent ?Uterine activity irregular (every 9 min) ?Dilation: 1 ?Effacement (%): Thick ?Station: -3 ?Exam by:: Dr. Cy Blamer ? ? ?Prenatal labs: ?ABO, Rh: --/--/O POS (04/12 0015) ?Antibody: NEG (04/12 0015) ?Rubella: 2.44 (10/27 1607) ?RPR: Non Reactive (12/29 0950)  ?HBsAg: NON REACTIVE (11/09 1614)  ?HIV: NON REACTIVE (04/12 0019)  ?GBS: Negative/-- (03/08 1539)  ?2 hr Glucola normal ?Genetic screening  AFP negative, LR NIPS, Negative  carrier screening ?Anatomy US normal ? ?Prenatal Transfer Tool  ?Maternal Diabetes: No ?Genetic Screening: Normal ?Maternal Ultrasounds/Referrals: Normal ?Fetal Ultrasounds or other Referrals:  None ?Maternal Substance Abuse:  No ?Significant Maternal Medications:  None ?Significant Maternal Lab Results: Group B Strep negative ? ?Results for orders placed or performed during the hospital encounter of 07/05/21 (from the past 24 hour(s))  ?Type and screen  ? Collection Time: 07/05/21 12:15 AM  ?Result Value Ref Range  ? ABO/RH(D) O POS   ? Antibody Screen NEG   ? Sample Expiration    ?  07/08/2021,2359 ?Performed at [facility], [place] [address] [place], [place] [address] ?  ?CBC  ? Collection Time: 07/05/21 12:19 AM  ?Result Value Ref Range  ? WBC 12.4 (H) 4.0 - 10.5 K/uL  ? RBC 3.82 (L) 3.87 - 5.11 MIL/uL  ? Hemoglobin 10.7 (L) 12.0 - 15.0 g/dL  ? HCT 32.4 (L) 36.0 - 46.0 %  ? MCV 84.8 80.0 - 100.0 fL  ? MCH 28.0 26.0 - 34.0 pg  ? MCHC 33.0 30.0 - 36.0 g/dL  ? RDW 14.3 11.5 - 15.5 %  ? Platelets 320 150 - 400 K/uL  ? nRBC 0.0 0.0 - 0.2 %  ?Rapid HIV screen (HIV 1/2 Ab+Ag)  ? Collection Time: 07/05/21 12:19 AM  ?Result Value Ref Range  ? HIV-1 P24 Antigen - HIV24 NON REACTIVE NON REACTIVE  ? HIV 1/2 Antibodies NON REACTIVE NON REACTIVE  ? Interpretation (HIV Ag Ab)    ?  A non reactive test result means that HIV 1 or HIV 2 antibodies and HIV 1 p24 antigen were not detected in the specimen.  ? ? ?Patient Active Problem List  ? Diagnosis Date Noted  ? Post term pregnancy 07/05/2021  ? Pediatric pre-birth visit for expectant parent 06/14/2021  ? History of COVID-19 04/10/2021  ? Encounter for supervision of normal pregnancy, antepartum 01/12/2021  ? Episodic tension-type headache, not intractable 12/14/2017  ? Seizure-like activity ([place]) 12/13/2017  ? ? ?Assessment/Plan:  ?Aiva Miskell is a 21 y.o. G3P0020 at 92w0dhere for IOL for post dates. ? ?#Labor: Cervix in office was 1 cm on 3/28, today cervix is unchanged  at 1cm. Plan to start with cytotec. Patient at this time declines foley balloon. We discussed that if cervix remains at 1 cm after 1-2 doses of cytotec a FB could be helpful in ripening. Patient expresses understanding. ?#Pain: Plans waterbirth ?#FWB: Cat I ?#ID:  GBS neg ?#MOF: breast and bottle ?#MOC: Depo ?#Circ:  Yes ? ?#Elevated BP at admission ?No prior hx of elevated BP. Initial BP in 140s/90s. Recheck in 120s. No symptoms.  Check preE labs. ? ?#Desired waterbirth ?Signed consent and took class. Had multiple visits with CNMs. Some questions regarding eligibility given hx of seizure like activity. Per chart, and note on 3/17 CNM group ok with waterbirth given normal neuro workup in 2019. ? ?#?Hx of seizure like activity ?- See documentation below for neurology workup. At this time no episodes. Last episode in 2019 was the only episode. Per neurology normal EEG and suspected non-epileptiform. Will monitor. ?Per neurologist Dr. Melanee Left note in 2019: ?"She was at school and had fallen asleep in a class around lunchtime.  She woke up and went to her next class which was next door.  She was tired and was allowed to put her head down the desk for few minutes.  When the patient's friend and later the teacher tried to wake her up, she was unresponsive and had stiffening of her body and jerking movements that reportedly lasted for an hour before EMS was called.  I do not understand this. ?  ?The patient's father arrived at the hospital, she was noted to have rhythmic shaking of her upper extremities, but was awake and aware.  It appeared she had the observers in the emergency department that she was hyperventilating because she could be distracted from it. ?  ?These tremors went on for about 30 minutes and then she began to settle down. ?  ?She remembers waking up at school and remembers the transport to the hospital and the emergency department evaluation." ? ?Renard Matter, MD, MPH ?OB Fellow, Faculty Practice ? ? ? ?

## 2021-07-06 ENCOUNTER — Encounter (HOSPITAL_COMMUNITY): Payer: Self-pay | Admitting: Family Medicine

## 2021-07-06 DIAGNOSIS — O480 Post-term pregnancy: Secondary | ICD-10-CM

## 2021-07-06 DIAGNOSIS — Z3A41 41 weeks gestation of pregnancy: Secondary | ICD-10-CM

## 2021-07-06 MED ORDER — COCONUT OIL OIL: 1.0000 "application " | TOPICAL_OIL | Status: DC | PRN

## 2021-07-06 MED ORDER — IBUPROFEN 600 MG PO TABS
600.0000 mg | ORAL_TABLET | Freq: Four times a day (QID) | ORAL | Status: DC
  Administered 2021-07-06 (×2): 600 mg via ORAL
  Filled 2021-07-06 (×2): qty 1

## 2021-07-06 MED ORDER — WITCH HAZEL-GLYCERIN EX PADS: 1.0000 "application " | MEDICATED_PAD | CUTANEOUS | Status: DC | PRN

## 2021-07-06 MED ORDER — SIMETHICONE 80 MG PO CHEW: 80.0000 mg | CHEWABLE_TABLET | ORAL | Status: DC | PRN

## 2021-07-06 MED ORDER — PRENATAL MULTIVITAMIN CH
1.0000 | ORAL_TABLET | Freq: Every day | ORAL | Status: DC
  Filled 2021-07-06 (×2): qty 1

## 2021-07-06 MED ORDER — ACETAMINOPHEN 325 MG PO TABS
650.0000 mg | ORAL_TABLET | ORAL | Status: DC | PRN
  Filled 2021-07-06: qty 2

## 2021-07-06 MED ORDER — IBUPROFEN 100 MG/5ML PO SUSP
600.0000 mg | Freq: Four times a day (QID) | ORAL | Status: DC
Start: 2021-07-07 — End: 2021-07-08
  Administered 2021-07-06 – 2021-07-08 (×7): 600 mg via ORAL
  Filled 2021-07-06 (×7): qty 30

## 2021-07-06 MED ORDER — LACTATED RINGERS AMNIOINFUSION: INTRAVENOUS | Status: DC

## 2021-07-06 MED ORDER — DIPHENHYDRAMINE HCL 25 MG PO CAPS: 25.0000 mg | ORAL_CAPSULE | Freq: Four times a day (QID) | ORAL | Status: DC | PRN

## 2021-07-06 MED ORDER — BENZOCAINE-MENTHOL 20-0.5 % EX AERO
1.0000 "application " | INHALATION_SPRAY | CUTANEOUS | Status: DC | PRN
  Filled 2021-07-06: qty 56

## 2021-07-06 MED ORDER — TETANUS-DIPHTH-ACELL PERTUSSIS 5-2.5-18.5 LF-MCG/0.5 IM SUSY: 0.5000 mL | PREFILLED_SYRINGE | Freq: Once | INTRAMUSCULAR | Status: DC

## 2021-07-06 MED ORDER — ZOLPIDEM TARTRATE 5 MG PO TABS: 5.0000 mg | ORAL_TABLET | Freq: Every evening | ORAL | Status: DC | PRN

## 2021-07-06 MED ORDER — ONDANSETRON HCL 4 MG PO TABS: 4.0000 mg | ORAL_TABLET | ORAL | Status: DC | PRN

## 2021-07-06 MED ORDER — ONDANSETRON HCL 4 MG/2ML IJ SOLN: 4.0000 mg | INTRAMUSCULAR | Status: DC | PRN

## 2021-07-06 MED ORDER — SENNOSIDES-DOCUSATE SODIUM 8.6-50 MG PO TABS
2.0000 | ORAL_TABLET | Freq: Every day | ORAL | Status: DC
  Filled 2021-07-06 (×2): qty 2

## 2021-07-06 MED ORDER — DIBUCAINE (PERIANAL) 1 % EX OINT: 1.0000 "application " | TOPICAL_OINTMENT | CUTANEOUS | Status: DC | PRN

## 2021-07-06 NOTE — Lactation Note (Signed)
This note was copied from a baby's chart. ?Lactation Consultation Note ? ?Patient Name: Brianna Thornton ?Today's Date: 07/06/2021 ?Reason for consult: Initial assessment;Primapara;1st time breastfeeding;Term ?Age:20 hours ? ?LC in to visit with [name] of term baby.  Baby being held by FOB.  Baby had some respiratory distress and a code apgar was called. ? ?Currently baby not cueing.  Recommended and offered to help with positioning and latching to the breast.   ? ?Baby placed STS on Mom and baby started cueing.  Assisted with cross cradle hold.  Baby noted to have upper airway congestion.  LC bulb syringed both nares and baby's breathing became more quiet. ? ?Baby able to attain a deep latch after several attempts.  Mom receptive to a lot of teaching on breast sandwiching and positioning.  Baby sucked with deep jaw extensions and a few swallows noted.  Baby relaxed and Mom reports latch is comfortable. ? ?Mom encouraged to keep baby STS and offer the breast with cues.   ?Encouraged Mom to ask for help prn ? ?Maternal Data ?Has patient been taught Hand Expression?: Yes ?Does the patient have breastfeeding experience prior to this delivery?: No ? ?Feeding ?Mother's Current Feeding Choice: Breast Milk and Formula ? ?LATCH Score ?Latch: Grasps breast easily, tongue down, lips flanged, rhythmical sucking. (after a few attempts) ? ?Audible Swallowing: Spontaneous and intermittent ? ?Type of Nipple: Everted at rest and after stimulation ? ?Comfort (Breast/Nipple): Soft / non-tender ? ?Hold (Positioning): Assistance needed to correctly position infant at breast and maintain latch. ? ?LATCH Score: 9 ? ? ?Interventions ?Interventions: Breast feeding basics reviewed;Assisted with latch;Skin to skin;Breast massage;Hand express;Breast compression;Adjust position;Support pillows;Position options;Expressed milk;LC Services brochure ? ?Consult Status ?Consult Status: Follow-up ?Date: 07/07/21 ?Follow-up type: In-patient ? ? ? ?Johny Blamer E ?07/06/2021, 12:01 PM ? ? ? ?

## 2021-07-06 NOTE — Lactation Note (Signed)
This note was copied from a baby's chart. ?Lactation Consultation Note ?Baby crying when LC came into room. Placed at breast attempted to latch. Got mom to talk softly to baby. Baby stopped crying but wouldn't latch.  ?Noted increased shallow respirations. ?Placed baby between breast and covered baby. Reported respirations to RN. ?Will f/u on MBU. ? ?Patient Name: Brianna Thornton ?Today's Date: 07/06/2021 ?Reason for consult: L&D Initial assessment;Primapara;Term ?Age:20 hours ? ?Maternal Data ?  ? ?Feeding ?  ? ?LATCH Score ?Latch: Too sleepy or reluctant, no latch achieved, no sucking elicited. ? ?Audible Swallowing: None ? ?Type of Nipple: Everted at rest and after stimulation ? ?Comfort (Breast/Nipple): Soft / non-tender ? ?Hold (Positioning): Full assist, staff holds infant at breast ? ?LATCH Score: 4 ? ? ?Lactation Tools Discussed/Used ?  ? ?Interventions ?Interventions: Skin to skin;Assisted with latch;Adjust position ? ?Discharge ?  ? ?Consult Status ?Consult Status: Follow-up from L&D ?Date: 07/06/21 ?Follow-up type: In-patient ? ? ? ?Charyl Dancer ?07/06/2021, 5:56 AM ? ? ? ?

## 2021-07-06 NOTE — Progress Notes (Signed)
Labor Progress Note ?Brianna Thornton is a 21 y.o. G3P0020 at [redacted]w[redacted]d presented for IOL at term ?S: Patient is resting comfortably. Endorses mild SOB following epidural but otherwise denies any chest pain, headache or vision changes. ? ?O:  ?BP 119/84   Pulse 89   Temp 97.8 ?F (36.6 ?C) (Oral)   Resp 16   LMP 09/21/2020   SpO2 100%  ?EFM: 130 baseline /moderate variability /+acceleration  ? ?CVE: Dilation: 5 ?Effacement (%): 90 ?Cervical Position: Posterior ?Station: -1 ?Presentation: Vertex ?Exam by:: [name] ? ? ?A&P: 21 y.o. G3P0020 [redacted]w[redacted]d  ?#Labor: Progressing well. Continue plan of care, will reassess in 3-4 hours.  ?#Pain: Epidural, will hold epidural for an hour for SOB per anesthesia ?#FWB: Cat 1 ?#GBS negative ? ?Alen Bleacher, MD ?[facility] [place], [place] ?3:26 AM  ?

## 2021-07-06 NOTE — Progress Notes (Signed)
Patient ID: Brianna Thornton, female   DOB: [birth year], 21 y.o.   MRN: [id number] ?FHR with prolonged decelerations to 80-90s ?Usual measures utilized ? ?Vitals:  ? 07/05/21 2355 07/06/21 0000 07/06/21 0100 07/06/21 0102  ?BP:  110/65 (!) 122/94 93/71  ?Pulse:  93 89 83  ?Resp:      ?Temp: 97.8 ?F (36.6 ?C)     ?TempSrc: Oral     ?SpO2:  99%    ? ?IUPC and FSE placed ? ?UCs appear adequate, everi with Pitocin off ? ?Will monitor and use amnioinfusion if necessary ?Leave Pitocin off for 30 min then restart ?

## 2021-07-06 NOTE — Progress Notes (Signed)
Patient ID: Brianna Thornton, female   DOB: 2000-07-31, 21 y.o.   MRN: [id number] ?Epidural had to be turned off due to high level ? ?Variable decels with contractions ? ?Vitals:  ? 07/06/21 0300 07/06/21 0342 07/06/21 0401 07/06/21 0405  ?BP: 112/73  (!) 111/54 (!) 109/52  ?Pulse: (!) 109  87 90  ?Resp:      ?Temp:  98.7 ?F (37.1 ?C)    ?TempSrc:  Axillary    ?SpO2: 100%  99% 99%  ? ?FHR reassuring with average variability and accels but persistent variable decels ?Dilation: 10 ?Dilation Complete Date: 07/06/21 ?Dilation Complete Time: 0434 ?Effacement (%): 90 ?Cervical Position: Posterior ?Station: 0, Plus 1 ?Presentation: Vertex ?Exam by:: Mayford Knife CNM ? ?Attempted pushing with small amount of crowning ? ?Will prepare for delivery ?

## 2021-07-06 NOTE — Anesthesia Postprocedure Evaluation (Signed)
Anesthesia Post Note ? ?Patient: Brianna Thornton ? ?Procedure(s) Performed: AN AD HOC LABOR EPIDURAL ? ?  ? ?Patient location during evaluation: Mother Baby ?Anesthesia Type: Epidural ?Level of consciousness: awake, awake and alert and oriented ?Pain management: pain level controlled ?Vital Signs Assessment: post-procedure vital signs reviewed and stable ?Respiratory status: spontaneous breathing and respiratory function stable ?Cardiovascular status: blood pressure returned to baseline ?Postop Assessment: no headache, epidural receding, patient able to bend at knees, adequate PO intake, no backache, no apparent nausea or vomiting and able to ambulate ?Anesthetic complications: no ? ? ?No notable events documented. ? ?Last Vitals:  ?Vitals:  ? 07/06/21 0900 07/06/21 1330  ?BP: 120/70 100/71  ?Pulse: 87 74  ?Resp: 18 18  ?Temp: 37.2 ?C 36.6 ?C  ?SpO2:    ?  ?Last Pain:  ?Vitals:  ? 07/06/21 1330  ?TempSrc: Oral  ?PainSc: 4   ? ?Pain Goal: Patients Stated Pain Goal: 0 (07/05/21 0528) ? ?  ?  ?  ?  ?  ?  ?Epidural/Spinal Function Cutaneous sensation: Normal sensation (07/06/21 1330), Patient able to flex knees: Yes (07/06/21 1330), Patient able to lift hips off bed: Yes (07/06/21 1330), Back pain beyond tenderness at insertion site: No (07/06/21 1330), Progressively worsening motor and/or sensory loss: No (07/06/21 1330), Bowel and/or bladder incontinence post epidural: No (07/06/21 1330) ? ?Evelina Dun R ? ? ? ? ?

## 2021-07-07 MED ORDER — ACETAMINOPHEN 160 MG/5ML PO SOLN
650.0000 mg | ORAL | Status: DC | PRN
  Administered 2021-07-07 – 2021-07-08 (×3): 650 mg via ORAL
  Filled 2021-07-07 (×3): qty 20.3

## 2021-07-07 NOTE — Clinical Social Work Maternal (Signed)
?CLINICAL SOCIAL WORK MATERNAL/CHILD NOTE ? ?Patient Details  ?Name: Brianna Thornton ?MRN: [id number] ?Date of Birth: 2000/04/10 ? ?Date:  07/07/2021 ? ?Clinical Social Worker Initiating Note:  Nurse, learning disability Date/Time: Initiated:  07/07/21/1213    ? ?Child's Name:  Tacy Learn  ? ?Biological Parents:  Mother, Father (FOB is DeMarcus Ronnald Ramp 08/09/1998 [phone])  ? ?Need for Interpreter:  None  ? ?Reason for Referral:  Behavioral Health Concerns (hx of bipolar and depression since 10th grade.)  ? ?Address:  [place] [address]  ?  ?Phone number:  [phone] (home)    ? ?Additional phone number: FOB's number is [phone] ? ?Household Members/Support Persons (HM/SP):    (MOB shared she resides with her father.) ? ? ?HM/SP Name Relationship DOB or Age  ?HM/SP -1        ?HM/SP -2        ?HM/SP -3        ?HM/SP -4        ?HM/SP -5        ?HM/SP -6        ?HM/SP -7        ?HM/SP -8        ? ? ?Natural Supports (not living in the home):  Immediate Family, Spouse/significant other, Parent, Extended Family (MOB shared that FOB's family will also provide supports when needed.)  ? ?Professional Supports: None  ? ?Employment: Part-time  ? 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  ? ?Education:  High school graduate  ? ?Homebound arranged:   ? ?Financial Resources:  Medicaid  ? ?Other Resources:  WIC (MOB was given information to apply for Food Stamps.)  ? ?Cultural/Religious Considerations Which May Impact Care: None reported ? ?Strengths:  Ability to meet basic needs  , Pediatrician chosen, Home prepared for child  , Understanding of illness  ? ?Psychotropic Medications:        ? ?Pediatrician:    [place] area ? ?Pediatrician List:  ? ?[place]  ?[place]    ?[facility]    ?[facility]    ?[facility]    ?[facility]    ? ? ?Pediatrician Fax Number:   ? ?Risk Factors/Current Problems:  Mental Health Concerns     ? ?Cognitive State:  Alert  , Insightful  , Linear Thinking  , Goal Oriented  , Able to Concentrate    ? ?Mood/Affect:  Relaxed  , Happy  , Bright  , Calm  , Interested  , Comfortable    ? ?CSW Assessment: CSW received consult for hx Bipolar and Depression.  CSW met with MOB and FOB in room 510 to offer support and complete assessment.   ?Upon entering the room CSW congratulated MOB and FOB on the birth of infant. CSW advised FOB and MOB of the HIPAA policy and was informed that MOB would like for FOB to remain it the room while speaking with CSW. CSW proceeded with information about the reason for the visit. MOB advised CSW that she was diagnosed with bipolar and depression around 10th grade. MOB reports that she had access to therapy but chose to not take any medications.  MOB requested information for additional outpatient counseling resources CSW provided information. MOB denies feeling SI or HI at this. MOB expressed having all needed items to care for infant. [facility]. MOB expressed no further needs  ?CSW provided education regarding the  baby blues period vs. perinatal mood disorders, discussed treatment and gave resources for mental health follow up if concerns arise.  CSW recommends self-evaluation during the postpartum time period using the New Mom Checklist from Postpartum Progress and encouraged MOB to contact a medical professional if symptoms are noted at any time.   ?CSW provided review of Sudden Infant Death Syndrome (SIDS) precautions.   ?CSW identifies no further need for intervention and no barriers to discharge at this time. ?CSW Plan/Description:  No Further Intervention Required/No Barriers to Discharge, Sudden Infant Death Syndrome (SIDS) Education, Neonatal Abstinence Syndrome (NAS) Education, Other Patient/Family Education, Other Information/Referral to [employer]  ? ?Laurey Arrow, MSW, LCSW ?Clinical Social  Work ?[phone] ? ? ?Tiarna Koppen D BOYD-GILYARD, LCSW ?07/07/2021, 12:18 PM ? ?

## 2021-07-07 NOTE — Progress Notes (Signed)
Post Partum Day 1 ?Subjective: ?no complaints, up ad lib, voiding, and tolerating PO ? ?Objective: ?Blood pressure 111/72, pulse 75, temperature (!) 97.5 ?F (36.4 ?C), temperature source Oral, resp. rate 18, last menstrual period 09/21/2020, SpO2 100 %, unknown if currently breastfeeding. ? ?Physical Exam:  ?General: alert, cooperative, and no distress ?Lochia: appropriate ?Uterine Fundus: firm ?Incision: n/a ?DVT Evaluation: No evidence of DVT seen on physical exam. ? ?Recent Labs  ?  07/05/21 ?0019  ?HGB 10.7*  ?HCT 32.4*  ? ? ?Assessment/Plan: ?Plan for discharge tomorrow and Breastfeeding ? ? LOS: 2 days  ? ?Brianna Thornton ?07/07/2021, 5:24 AM  ? ? ?

## 2021-07-07 NOTE — Progress Notes (Signed)
Patient reported that she is having strong cramps 9/10 and voided on herself. Patient was ambulated to the bathroom and was able to void again. Post void bladder scan was 42ml. Scant bleeding with a firm fundus. Encouraged patient to do void q2 hrs and notify staff if incontinence continues. Timoteo Ace, RN ? ?

## 2021-07-07 NOTE — Lactation Note (Signed)
This note was copied from a baby's chart. ?Lactation Consultation Note ? ?Patient Name: Brianna Thornton ?Today's Date: 07/07/2021 ?Reason for consult: Follow-up assessment;1st time breastfeeding;Primapara;Term ?Age:20 hours ? ? ?P1 mother whose infant is now 57 hours old.  This is a term baby at 41+1 weeks.  Mother's current feeding preference is breast. ? ?Mother was struggling to latch when I arrived; offered to assist and mother receptive.  Few attempts made; no latch.  Tech entered for the weight, baby had a large stool, latch attempted again with success.  Reviewed basic latching technique with mother.  Encouraged a deep latch and assisted with body alignment.  Observed baby feeding for 5 minutes prior to exiting the room. ? ?Father present.  Suggested mother call for further lactation assistance as needed. ? ? ?Maternal Data ?Has patient been taught Hand Expression?: Yes ?Does the patient have breastfeeding experience prior to this delivery?: No ? ?Feeding ?Mother's Current Feeding Choice: Breast Milk ? ?LATCH Score ?Latch: Repeated attempts needed to sustain latch, nipple held in mouth throughout feeding, stimulation needed to elicit sucking reflex. ? ?Audible Swallowing: None ? ?Type of Nipple: Everted at rest and after stimulation ? ?Comfort (Breast/Nipple): Soft / non-tender ? ?Hold (Positioning): Assistance needed to correctly position infant at breast and maintain latch. ? ?LATCH Score: 6 ? ? ?Lactation Tools Discussed/Used ?  ? ?Interventions ?Interventions: Breast feeding basics reviewed;Assisted with latch;Skin to skin;Breast massage;Breast compression;Education;Position options;Support pillows;Adjust position ? ?Discharge ?  ? ?Consult Status ?Consult Status: Follow-up ?Date: 07/08/21 ?Follow-up type: In-patient ? ? ? ?Toby Breithaupt R Muhsin Doris ?07/07/2021, 5:57 AM ? ? ? ?

## 2021-07-08 DIAGNOSIS — R030 Elevated blood-pressure reading, without diagnosis of hypertension: Secondary | ICD-10-CM

## 2021-07-08 MED ORDER — IBUPROFEN 100 MG/5ML PO SUSP
600.0000 mg | Freq: Four times a day (QID) | ORAL | 1 refills | Status: DC
Start: 2021-07-08 — End: 2022-01-24

## 2021-07-08 NOTE — Discharge Summary (Signed)
? ?  Postpartum Discharge Summary ? ?Date of Service updated ? ?   ?Patient Name: Brianna Thornton ?DOB: [birth year] ?MRN: [id number] ? ?Date of admission: 07/05/2021 ?Delivery date:07/06/2021  ?Delivering provider: Florian Buff  ?Date of discharge: 07/08/2021 ? ?Admitting diagnosis: Post term pregnancy [O48.0] ?Intrauterine pregnancy: [redacted]w[redacted]d    ?Secondary diagnosis:  Principal Problem: ?  Post term pregnancy ?Active Problems: ?  Encounter for supervision of normal pregnancy, antepartum ?  History of COVID-19 ?  Shoulder dystocia during labor and delivery ? ?Additional problems: None    ?Discharge diagnosis: Term Pregnancy Delivered                                              ?Post partum procedures: None  ?Augmentation: Pitocin ?Complications: None ? ?Hospital course: Induction of Labor With Vaginal Delivery   ?21y.o. yo G3P1021 at 438w1das admitted to the hospital 07/05/2021 for induction of labor.  Indication for induction: Postdates.  Patient had an uncomplicated labor course as follows: ?Membrane Rupture Time/Date: 1:00 PM ,07/05/2021   ?Delivery Method:Vaginal, Spontaneous  ?Episiotomy: None  ?Lacerations:  2nd degree;Perineal  ?Details of delivery can be found in separate delivery note.  Patient had a routine postpartum course. Patient is discharged home 07/08/21. ? ?Newborn Data: ?Birth date:07/06/2021  ?Birth time:5:20 AM  ?Gender:Female  ?Living status:Living  ?Apgars:4 ,7  ?Weight:3580 g  ? ?Magnesium Sulfate received: No ?BMZ received: No ?Rhophylac:No ?MMR:No ?T-DaP:Given prenatally ?Flu: No ?Transfusion:No ? ?Physical exam  ?Vitals:  ? 07/07/21 0520 07/07/21 1423 07/07/21 2109 07/08/21 0547  ?BP: 111/72 121/71 (!) 117/58 126/69  ?Pulse: 75 78 74 79  ?Resp: 18 16 16 20   ?Temp: (!) 97.5 ?F (36.4 ?C) 98.2 ?F (36.8 ?C) 98.1 ?F (36.7 ?C) 98.7 ?F (37.1 ?C)  ?TempSrc: Oral Oral Oral Oral  ?SpO2: 100% 100%  98%  ? ?General: alert, cooperative, and no distress ?Lochia: appropriate ?Uterine Fundus: firm ?Incision:  N/A ?DVT Evaluation: No significant calf/ankle edema. ?Labs: ?Lab Results  ?Component Value Date  ? WBC 12.4 (H) 07/05/2021  ? HGB 10.7 (L) 07/05/2021  ? HCT 32.4 (L) 07/05/2021  ? MCV 84.8 07/05/2021  ? PLT 320 07/05/2021  ? ? ?  Latest Ref Rng & Units 07/05/2021  ? 12:19 AM  ?CMP  ?Glucose 70 - 99 mg/dL 104    ?BUN 6 - 20 mg/dL <5    ?Creatinine 0.44 - 1.00 mg/dL 0.70    ?Sodium 135 - 145 mmol/L 138    ?Potassium 3.5 - 5.1 mmol/L 3.7    ?Chloride 98 - 111 mmol/L 108    ?CO2 22 - 32 mmol/L 22    ?Calcium 8.9 - 10.3 mg/dL 8.9    ?Total Protein 6.5 - 8.1 g/dL 6.2    ?Total Bilirubin 0.3 - 1.2 mg/dL 0.4    ?Alkaline Phos 38 - 126 U/L 172    ?AST 15 - 41 U/L 17    ?ALT 0 - 44 U/L 9    ? ?Edinburgh Score: ? ?  07/07/2021  ?  1:55 PM  ?Edinburgh Postnatal Depression Scale Screening Tool  ?I have been able to laugh and see the funny side of things. 0  ?I have looked forward with enjoyment to things. 0  ?I have blamed myself unnecessarily when things went wrong. 1  ?I have been anxious or worried for no good reason.  0  ?I have felt scared or panicky for no good reason. 0  ?Things have been getting on top of me. 0  ?I have been so unhappy that I have had difficulty sleeping. 0  ?I have felt sad or miserable. 1  ?I have been so unhappy that I have been crying. 1  ?The thought of harming myself has occurred to me. 0  ?Edinburgh Postnatal Depression Scale Total 3  ? ? ? ?After visit meds:  ?Allergies as of 07/08/2021   ?No Known Allergies ?  ? ?  ?Medication List  ?  ? ?TAKE these medications   ? ?acetaminophen 500 MG tablet ?Commonly known as: TYLENOL ?Take 2 tablets (1,000 mg total) by mouth every 6 (six) hours as needed (once). ?  ?Blood Pressure Kit Devi ?1 Device by Does not apply route as needed. ?  ?famotidine 20 MG tablet ?Commonly known as: Pepcid ?Take 1 tablet (20 mg total) by mouth 2 (two) times daily. ?  ?ibuprofen 100 MG/5ML suspension ?Commonly known as: ADVIL ?Take 30 mLs (600 mg total) by mouth every 6 (six)  hours. ?  ?Vitafol Gummies 3.33-0.333-34.8 MG Chew ?Chew 1 tablet by mouth daily. ?  ? ?  ? ? ? ?Discharge home in stable condition ?Infant Feeding: Breast ?Infant Disposition:home with mother ?Discharge instruction: per After Visit Summary and Postpartum booklet. ?Activity: Advance as tolerated. Pelvic rest for 6 weeks.  ?Diet: routine diet ?Future Appointments:No future appointments. ?Follow up Visit: ? ?Message sent to [place] by Dr Higinio Plan:  ?Please schedule this patient for a In person postpartum visit in 6 weeks with the following provider: Any provider. ?Additional Postpartum F/U: None   ?Low risk pregnancy complicated by: shoulder dystocia  ?Delivery mode:  Vaginal, Spontaneous  ?Anticipated Birth Control:  Depo ? ? ?07/08/2021 ?Patriciaann Clan, DO ? ? ? ?

## 2021-07-08 NOTE — Lactation Note (Signed)
This note was copied from a baby's chart. ?Lactation Consultation Note ? ?Patient Name: Brianna Thornton ?Today's Date: 07/08/2021 ?Reason for consult: Follow-up assessment ?Age:20 hours ? ?LC in to room prior to discharge. Baby is latched upon arrival. Noted optimal alignment, positioning and neck/back support. Observed sucking and swallowing, mother keeps baby awake to continue feeding. Mother reports a little nipple soreness and LC encouraged to use expressed breast milk followed by coconut oil for nipple care. Mother states her "milk is in".  ?Discussed normal behavior and patterns after 24h, voids and stools as signs good intake, pumping, clusterfeeding, skin to skin. Talked about managing engorgement and reviewed pumping basics as well as milk storage.  ? ?Plan: ?1-Aim for a deep, comfortable latch, breastfeeding on demand or 8-12 times in 24h period. ?2-Hand express/pump as needed for supplementation ?3-Encouraged maternal rest, hydration and food intake.  ?4-Nipple care as discussed.  ? ?Contact LC as needed for feeds/support/concerns/questions. All questions answered at this time. Reviewed LC brochure.    ? ?Maternal Data ?Has patient been taught Hand Expression?: No ?Does the patient have breastfeeding experience prior to this delivery?: No ? ?Feeding ?Mother's Current Feeding Choice: Breast Milk ? ?LATCH Score ?Latch: Grasps breast easily, tongue down, lips flanged, rhythmical sucking. ? ?Audible Swallowing: Spontaneous and intermittent ? ?Type of Nipple: Everted at rest and after stimulation ? ?Comfort (Breast/Nipple): Soft / non-tender ? ?Hold (Positioning): No assistance needed to correctly position infant at breast. ? ?LATCH Score: 10 ? ?Interventions ?Interventions: Breast feeding basics reviewed;Skin to skin;Hand express;Expressed milk;DEBP;Hand pump;Education;LC Services brochure ? ?Discharge ?Discharge Education: Engorgement and breast care;Warning signs for feeding baby ?WIC Program: Yes ? ?Consult  Status ?Consult Status: Complete ?Date: 07/08/21 ?Follow-up type: Call as needed ? ? ? ?Crystall Donaldson A Higuera Ancidey ?07/08/2021, 8:40 AM ? ? ? ?

## 2021-07-09 ENCOUNTER — Other Ambulatory Visit: Payer: Self-pay

## 2021-07-09 ENCOUNTER — Inpatient Hospital Stay (HOSPITAL_COMMUNITY)
Admission: AD | Admit: 2021-07-09 | Discharge: 2021-07-10 | Disposition: A | Discharge status: Home / Self Care | Payer: Medicaid Other | Attending: Obstetrics and Gynecology | Admitting: Obstetrics and Gynecology

## 2021-07-09 DIAGNOSIS — M545 Low back pain, unspecified: Secondary | ICD-10-CM | POA: Diagnosis not present

## 2021-07-09 DIAGNOSIS — O165 Unspecified maternal hypertension, complicating the puerperium: Secondary | ICD-10-CM | POA: Diagnosis not present

## 2021-07-09 LAB — CBC WITH DIFFERENTIAL/PLATELET
Abs Immature Granulocytes: 0.15 10*3/uL — ABNORMAL HIGH (ref 0.00–0.07)
Basophils Absolute: 0 10*3/uL (ref 0.0–0.1)
Basophils Relative: 0 %
Eosinophils Absolute: 0.2 10*3/uL (ref 0.0–0.5)
Eosinophils Relative: 2 %
HCT: 28.7 % — ABNORMAL LOW (ref 36.0–46.0)
Hemoglobin: 9.4 g/dL — ABNORMAL LOW (ref 12.0–15.0)
Immature Granulocytes: 1 %
Lymphocytes Relative: 17 %
Lymphs Abs: 2.1 10*3/uL (ref 0.7–4.0)
MCH: 27.8 pg (ref 26.0–34.0)
MCHC: 32.8 g/dL (ref 30.0–36.0)
MCV: 84.9 fL (ref 80.0–100.0)
Monocytes Absolute: 0.9 10*3/uL (ref 0.1–1.0)
Monocytes Relative: 8 %
Neutro Abs: 9 10*3/uL — ABNORMAL HIGH (ref 1.7–7.7)
Neutrophils Relative %: 72 %
Platelets: 386 10*3/uL (ref 150–400)
RBC: 3.38 MIL/uL — ABNORMAL LOW (ref 3.87–5.11)
RDW: 14.6 % (ref 11.5–15.5)
WBC: 12.4 10*3/uL — ABNORMAL HIGH (ref 4.0–10.5)
nRBC: 0.2 % (ref 0.0–0.2)

## 2021-07-09 MED ORDER — IBUPROFEN 800 MG PO TABS
800.0000 mg | ORAL_TABLET | Freq: Once | ORAL | Status: AC
  Administered 2021-07-10: 800 mg via ORAL
  Filled 2021-07-09: qty 1

## 2021-07-09 NOTE — MAU Provider Note (Addendum)
?History  ?  ? ?CSN: [id number] ? ?Arrival date and time: 07/09/21 2212 ? ? Event Date/Time  ? First Provider Initiated Contact with Patient 07/09/21 2341   ?  ? ?Chief Complaint  ?Patient presents with  ? Vaginal Bleeding  ? ?HPI ?Brianna Thornton is a 21 y.o. [id number] postpartum from a vaginal delivery on 4/13 who presents with vaginal bleeding. She states she had an episode of lower back pain this evening that was followed by passing a small clot. She reports she went back to the bathroom and passed a palm sized clot. She reports since then she is having more bleeding than she thinks is normal. She also reports lower abdominal and lower back pain that she rates a 6/10. She has not taken anything for the pain.  ? ?OB History   ? ? Gravida  ?3  ? Para  ?1  ? Term  ?1  ? Preterm  ?   ? AB  ?2  ? Living  ?1  ?  ? ? SAB  ?2  ? IAB  ?   ? Ectopic  ?   ? Multiple  ?0  ? Live Births  ?1  ?   ?  ?  ? ? ?Past Medical History:  ?Diagnosis Date  ? Asthma   ? Depression   ? therapist said bipolar, pt does not want to take meds  ? Ovarian cyst   ? ? ?Past Surgical History:  ?Procedure Laterality Date  ? NO PAST SURGERIES    ? ? ?Family History  ?Problem Relation Age of Onset  ? Bipolar disorder Mother   ? Healthy Father   ? ? ?Social History  ? ?Tobacco Use  ? Smoking status: Never  ? Smokeless tobacco: Never  ?Vaping Use  ? Vaping Use: Never used  ?Substance Use Topics  ? Alcohol use: No  ? Drug use: Never  ? ? ?Allergies: No Known Allergies ? ?Medications Prior to Admission  ?Medication Sig Dispense Refill Last Dose  ? acetaminophen (TYLENOL) 500 MG tablet Take 2 tablets (1,000 mg total) by mouth every 6 (six) hours as needed (once). (Patient not taking: Reported on 05/15/2021) 30 tablet 0   ? Blood Pressure Monitoring (BLOOD PRESSURE KIT) DEVI 1 Device by Does not apply route as needed. (Patient not taking: Reported on 05/31/2021) 1 each 0   ? famotidine (PEPCID) 20 MG tablet Take 1 tablet (20 mg total) by mouth 2 (two) times daily.  60 tablet 1   ? ibuprofen (ADVIL) 100 MG/5ML suspension Take 30 mLs (600 mg total) by mouth every 6 (six) hours. 473 mL 1   ? Prenatal Vit-Fe Phos-FA-Omega (VITAFOL GUMMIES) 3.33-0.333-34.8 MG CHEW Chew 1 tablet by mouth daily. 90 tablet 11   ? ? ?Review of Systems  ?Constitutional: Negative.  Negative for fatigue and fever.  ?HENT: Negative.    ?Respiratory: Negative.  Negative for shortness of breath.   ?Cardiovascular: Negative.  Negative for chest pain.  ?Gastrointestinal:  Positive for abdominal pain. Negative for constipation, diarrhea, nausea and vomiting.  ?Genitourinary:  Positive for vaginal bleeding. Negative for dysuria and vaginal discharge.  ?Musculoskeletal:  Positive for back pain.  ?Neurological: Negative.  Negative for dizziness and headaches.  ?Physical Exam  ? ?Blood pressure (!) 145/79, pulse 73, temperature 98.6 ?F (37 ?C), temperature source Oral, resp. rate 18, height _0  (1.549 m), weight 58.5 kg, last menstrual period 09/21/2020, SpO2 99 %, unknown if currently breastfeeding. ? ?Physical Exam ?Vitals and nursing note  reviewed.  ?Constitutional:   ?   General: She is not in acute distress. ?   Appearance: She is well-developed.  ?HENT:  ?   Head: Normocephalic.  ?Eyes:  ?   Pupils: Pupils are equal, round, and reactive to light.  ?Cardiovascular:  ?   Rate and Rhythm: Normal rate and regular rhythm.  ?   Heart sounds: Normal heart sounds.  ?Pulmonary:  ?   Effort: Pulmonary effort is normal. No respiratory distress.  ?   Breath sounds: Normal breath sounds.  ?Abdominal:  ?   General: Bowel sounds are normal. There is no distension.  ?   Palpations: Abdomen is soft.  ?   Tenderness: There is no abdominal tenderness.  ?Skin: ?   General: Skin is warm and dry.  ?Neurological:  ?   Mental Status: She is alert and oriented to person, place, and time.  ?Psychiatric:     ?   Mood and Affect: Mood normal.     ?   Behavior: Behavior normal.     ?   Thought Content: Thought content normal.     ?    Judgment: Judgment normal.  ? ? ?MAU Course  ?Procedures ? ?MDM ?CBC, CMP ?Ibuprofen PO- patient declines IM ?US Pelvis complete ? ?Discussed speculum exam to look at bleeding and patient declines ?Care turned over to K. Cythina Mickelsen CNM at 0000. ?Wende Mott, CNM ? ? ?Patient returned from [place]; she is still complaining of LLQ pain. She had ibuprofen at 0100; she has declined speculum exam but would like exam in 40 minutes after medication has worked. She is drinking water and eating crackers.  ? ?HA is now resolved; LFTs and platelets are normal. [place] normal; reviewed with Dr. Ilda Basset.  ? ?She had flexeril and ibuprofen; her pain was a 9/10 prior to medicine and now it is a 0/10. She is talking, laughing and bleeding is mild. Speculum exam NOT done.  ? ? ? ? ?Assessment and Plan  ? ?1. Postpartum hypertension   ? ?Patient had elevated BPs in MAU so she was started on norvasc per Dr. Ilda Basset and message sent to [facility] for BP check on Friday. Strict return precautions reviewed and warning signs of pre-e reviewed. Her bleeding was minimal and patient ambulating with assistance to the bathroom and had no bleedings ?-Urine culture pending ?-patient given RX for Flexeril as wel. Discussed positioning, normal changes in body flexion both PP and with breastfeeding, most likely back pain and side pain is MSK ?-patient will check my Chart for BP appt ?-all questions answered ? ? ?Patient Vitals for the past 24 hrs: ? BP Temp Temp src Pulse Resp SpO2 Height Weight  ?07/10/21 0258 -- 98.4 ?F (36.9 ?C) Oral -- -- -- -- --  ?07/10/21 0231 121/65 -- -- 62 -- -- -- --  ?07/10/21 0201 129/78 -- -- (!) 58 -- -- -- --  ?07/10/21 0131 134/84 -- -- 67 -- -- -- --  ?07/10/21 0101 (!) 136/92 -- -- 62 -- -- -- --  ?07/10/21 0058 134/85 -- -- 67 -- -- -- --  ?07/10/21 0001 126/61 -- -- 67 -- -- -- --  ?07/09/21 2307 (!) 145/79 98.6 ?F (37 ?C) Oral 73 18 99 % _0  (1.549 m) 58.5 kg  ? ? ? ?

## 2021-07-09 NOTE — MAU Note (Signed)
.  Brianna Thornton is a 21 y.o.  3 days PP  with NSVD on 07/06/2021. here in MAU reporting: passed 1 large blood clot at 2140. Also reports increased cramping. Has take tylenol and used heating pad with minimal relief-pt has not picked up her ibuprofen  RX. Also reports pulsing/throbbing that radiates from lower back to neck. Pt denies headache. ? ?Onset of complaint: 2140 ?Pain score: 8-back ?Vitals:  ? 07/09/21 2307  ?BP: (!) 145/79  ?Pulse: 73  ?Resp: 18  ?Temp: 98.6 ?F (37 ?C)  ?SpO2: 99%  ? ?  ?

## 2021-07-10 ENCOUNTER — Inpatient Hospital Stay (HOSPITAL_COMMUNITY): Payer: Medicaid Other

## 2021-07-10 DIAGNOSIS — O165 Unspecified maternal hypertension, complicating the puerperium: Secondary | ICD-10-CM

## 2021-07-10 LAB — URINALYSIS, ROUTINE W REFLEX MICROSCOPIC
Bilirubin Urine: NEGATIVE
Glucose, UA: NEGATIVE mg/dL
Ketones, ur: NEGATIVE mg/dL
Nitrite: NEGATIVE
Protein, ur: NEGATIVE mg/dL
Specific Gravity, Urine: 1.013 (ref 1.005–1.030)
pH: 7 (ref 5.0–8.0)

## 2021-07-10 LAB — COMPREHENSIVE METABOLIC PANEL
ALT: 17 U/L (ref 0–44)
AST: 17 U/L (ref 15–41)
Albumin: 2.5 g/dL — ABNORMAL LOW (ref 3.5–5.0)
Alkaline Phosphatase: 123 U/L (ref 38–126)
Anion gap: 3 — ABNORMAL LOW (ref 5–15)
BUN: 9 mg/dL (ref 6–20)
CO2: 24 mmol/L (ref 22–32)
Calcium: 8.6 mg/dL — ABNORMAL LOW (ref 8.9–10.3)
Chloride: 113 mmol/L — ABNORMAL HIGH (ref 98–111)
Creatinine, Ser: 0.58 mg/dL (ref 0.44–1.00)
GFR, Estimated: >60 mL/min (ref >60)
Glucose, Bld: 86 mg/dL (ref 70–99)
Potassium: 3.6 mmol/L (ref 3.5–5.1)
Sodium: 140 mmol/L (ref 135–145)
Total Bilirubin: <0.1 mg/dL — ABNORMAL LOW (ref 0.3–1.2)
Total Protein: 5.2 g/dL — ABNORMAL LOW (ref 6.5–8.1)

## 2021-07-10 IMAGING — US US PELVIS COMPLETE
1 series · 15 of 25 positions shown · non-contrast
Comparison: None.

CLINICAL DATA: Delivery [DATE], left lower quadrant abdominal
pain, postpartum bleeding

EXAM:
TRANSABDOMINAL ULTRASOUND OF PELVIS
TECHNIQUE: Transabdominal ultrasound examination of the pelvis was performed
including evaluation of the uterus, ovaries, adnexal regions, and
pelvic cul-de-sac.

[Series 1: us pelvis complete · 15 of 35 slices shown]
[im 1/35]
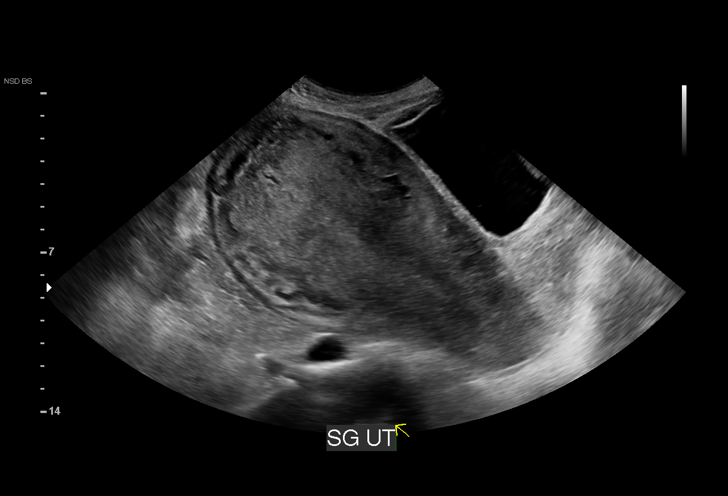
[im 3/35]
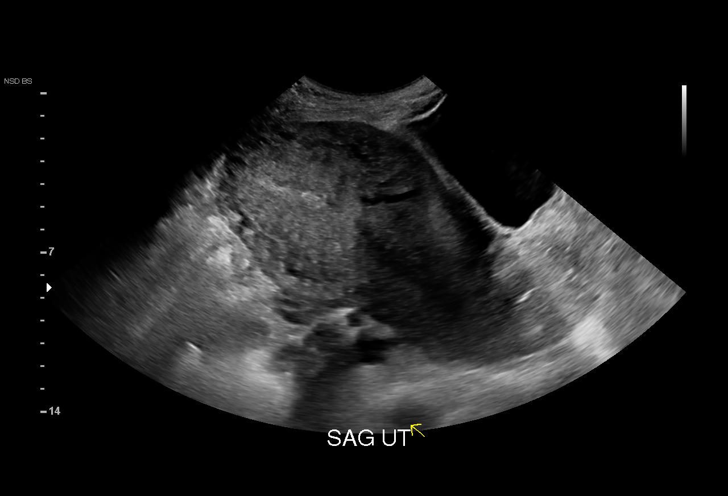
[im 6/35]
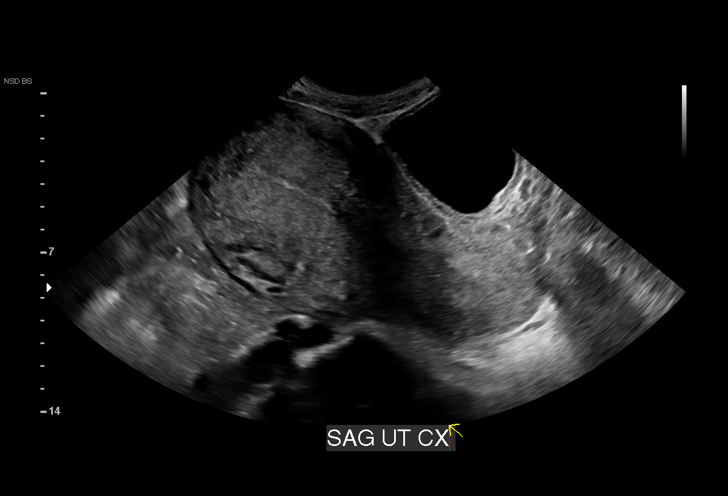
[im 8/35]
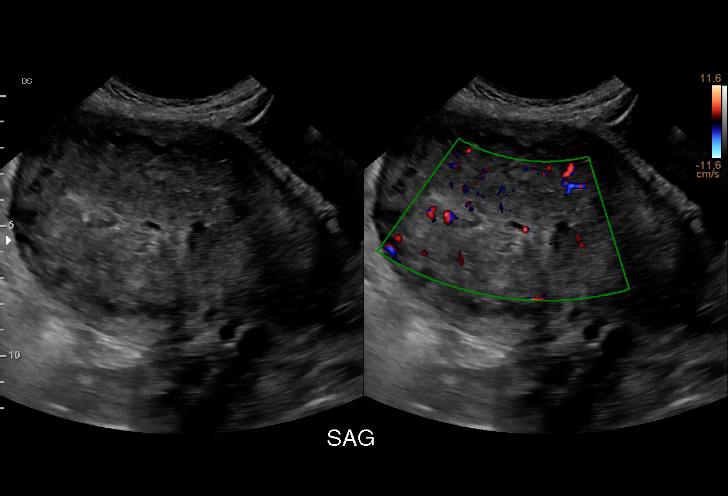
[im 10/35]
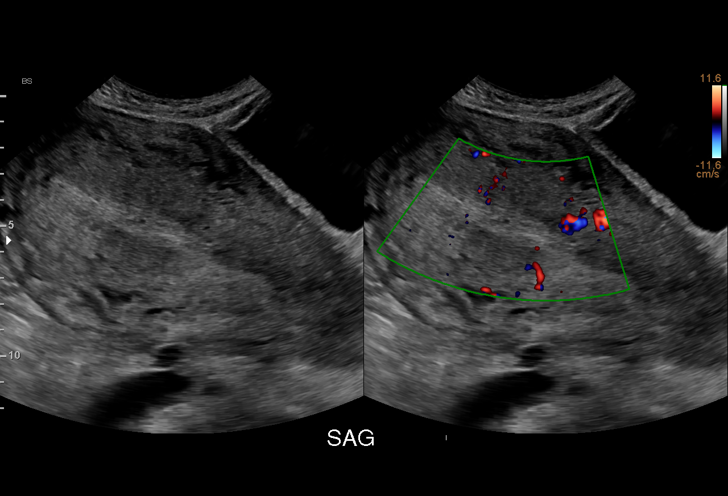
[im 13/35]
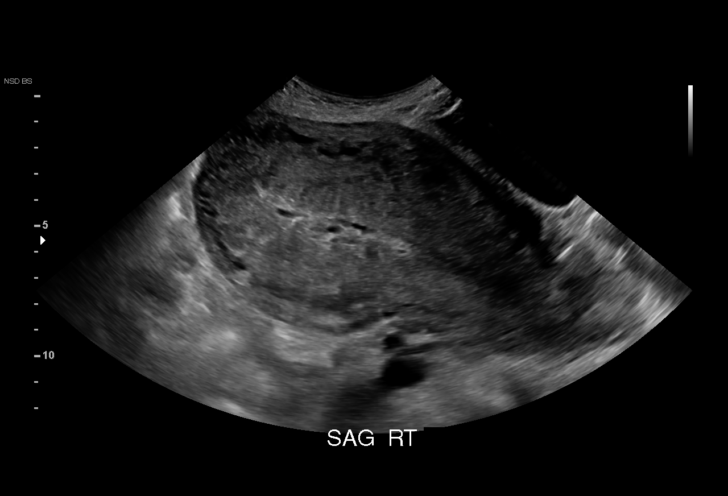
[im 15/35]
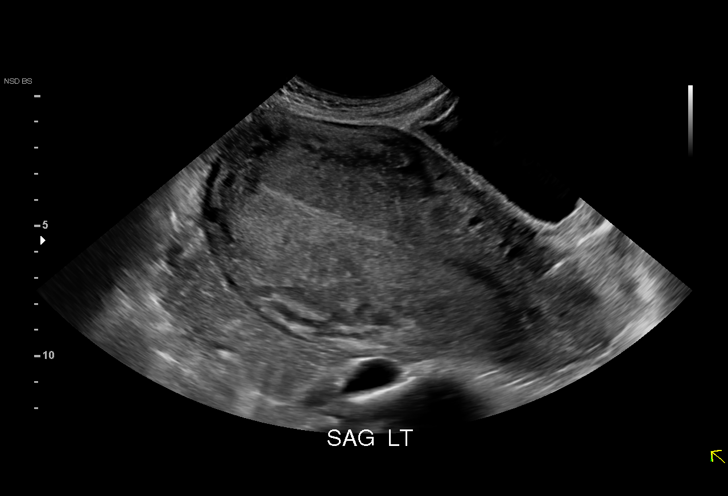
[im 18/35]
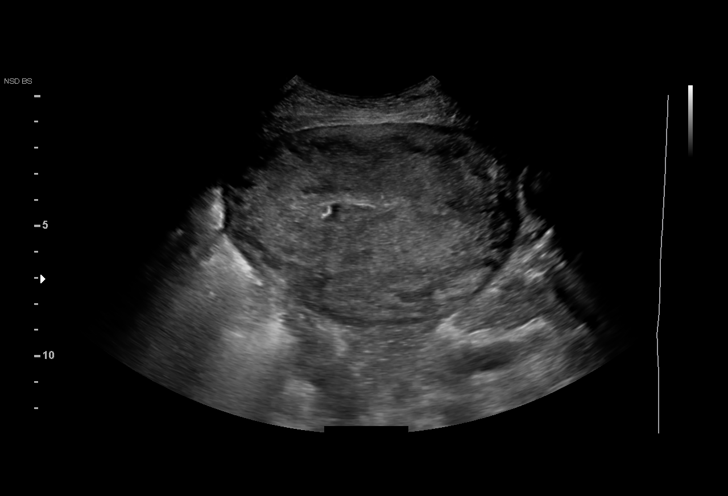
[im 20/35]
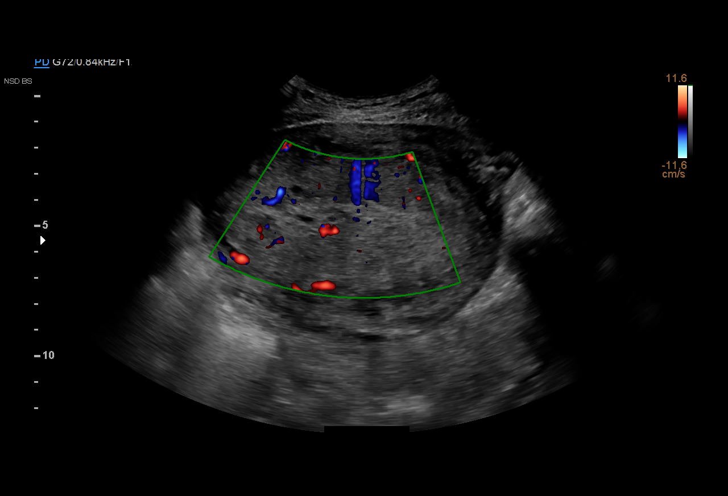
[im 22/35]
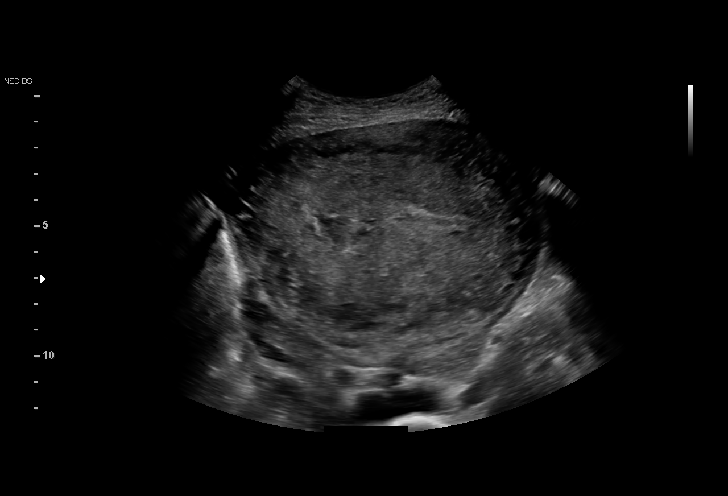
[im 25/35]
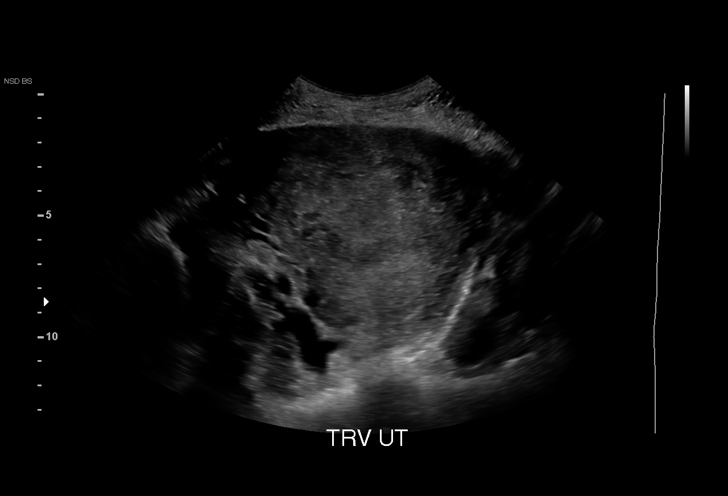
[im 27/35]
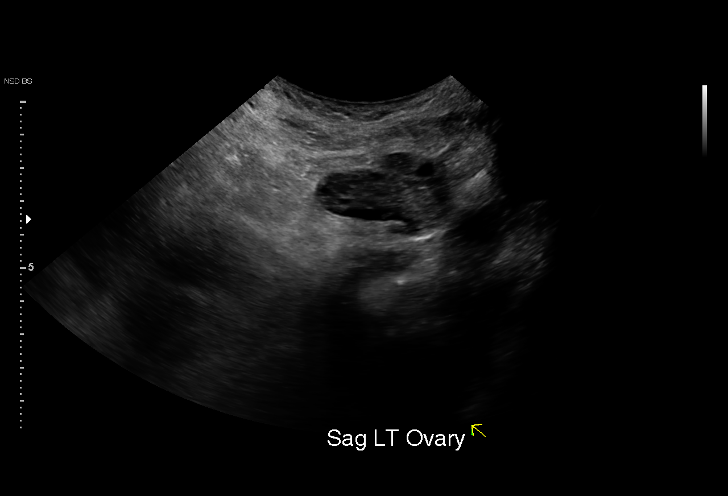
[im 29/35]
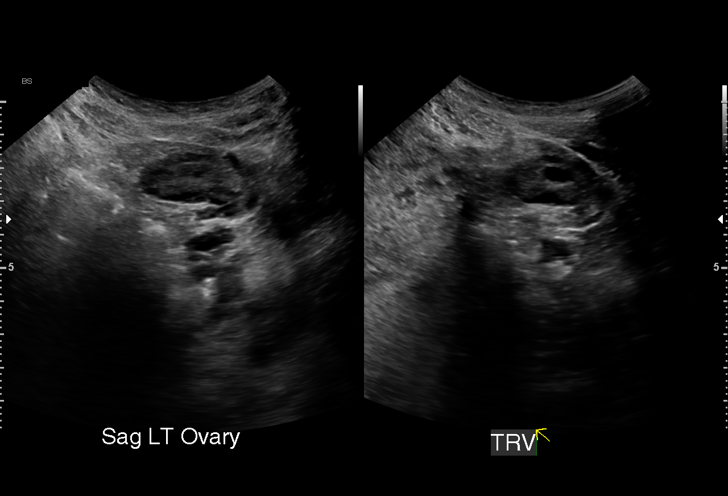
[im 32/35]
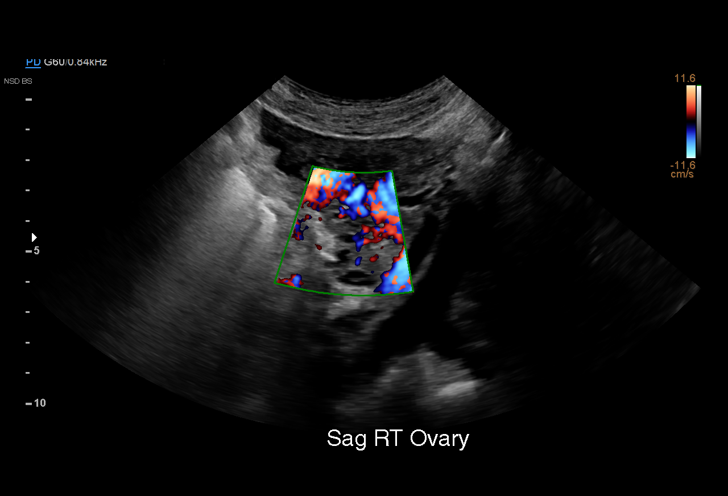
[im 35/35]
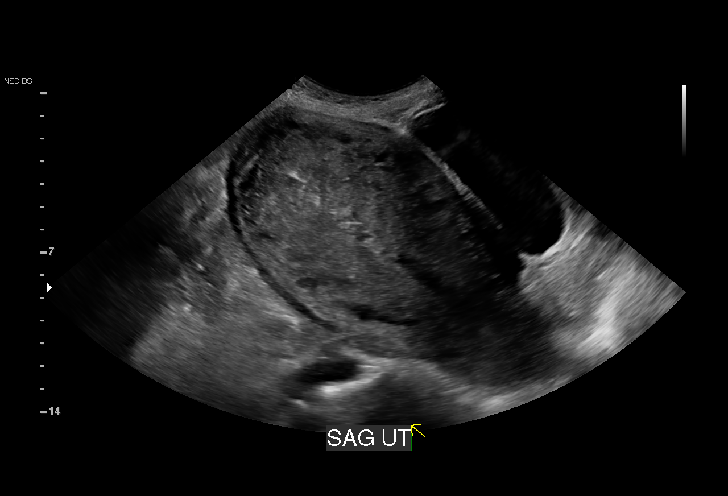

[15 of 25 positions shown; findings below may reference images not displayed]

FINDINGS: Uterus

Measurements: 16.4 x 8.8 x 12.2 cm = volume: 920 mL. The uterus is
anteverted. The cervix is not well visualized on this examination.
No intrauterine masses are seen.

Endometrium

Thickness: Up to 15 mm. There is mild heterogeneity and cystic
change involving the endometrium, asymmetrically more severe on the
right, with a small amount of simple appearing fluid seen within the
endometrial cavity. This may represent the residua of recent
delivery. No significant vascularized intracavitary soft tissue is
identified.

Right ovary

Measurements: 3.2 x 2.1 x 2.3 cm = volume: 8 mL. Normal
appearance/no adnexal mass.

Left ovary

Measurements: 3.2 x 1.6 x 2.7 cm = volume: 7 mL. Normal
appearance/no adnexal mass.

Other findings:  No abnormal free fluid.
IMPRESSION: Minimally thickened and heterogeneous endometrial tissue and trace
simple appearing fluid within the endometrial cavity, likely
postpartum in nature. No significant vascularized intracavitary soft
tissue to suggest retained products of conception

## 2021-07-10 MED ORDER — CYCLOBENZAPRINE HCL 5 MG PO TABS
10.0000 mg | ORAL_TABLET | Freq: Once | ORAL | Status: AC
  Administered 2021-07-10: 10 mg via ORAL
  Filled 2021-07-10: qty 2

## 2021-07-10 MED ORDER — AMLODIPINE BESYLATE 5 MG PO TABS: 5.0000 mg | ORAL_TABLET | Freq: Every day | ORAL | 1 refills | Status: DC

## 2021-07-10 MED ORDER — AMLODIPINE BESYLATE 5 MG PO TABS: 5.0000 mg | ORAL_TABLET | Freq: Every day | ORAL | Status: DC

## 2021-07-10 MED ORDER — CYCLOBENZAPRINE HCL 10 MG PO TABS: 10.0000 mg | ORAL_TABLET | Freq: Two times a day (BID) | ORAL | 0 refills | Status: DC | PRN

## 2021-07-11 LAB — URINE CULTURE: Culture: NO GROWTH

## 2021-07-14 ENCOUNTER — Ambulatory Visit (INDEPENDENT_AMBULATORY_CARE_PROVIDER_SITE_OTHER): Payer: Medicaid Other

## 2021-07-14 VITALS — BP 103/63 | HR 93

## 2021-07-14 DIAGNOSIS — Z0130 Encounter for examination of blood pressure without abnormal findings: Secondary | ICD-10-CM

## 2021-07-14 NOTE — Progress Notes (Addendum)
..  Subjective:  ?Brianna Thornton is a 21 y.o. female here for BP check.  ? ?Hypertension ROS: taking medications as instructed, no medication side effects noted, no TIA's, no chest pain on exertion, no dyspnea on exertion, and no swelling of ankles.  ? ? ?Objective:  ?BP 103/63   Pulse 93   ?Appearance alert, well appearing, and in no distress. ?General exam BP noted to be well controlled today in office.  ? ? ?Assessment:   ?Blood Pressure well controlled.  ? ?Plan:  ?Current treatment plan is effective, no change in therapy.Marland Kitchen ?PP visit scheduled for 08/23/21 ? ? ?Natale Milch, RN ? ? ?Patient was assessed and managed by nursing staff during this encounter. I have reviewed the chart and agree with the documentation and plan. I have also made any necessary editorial changes. ? ?Jaynie Collins, MD ?07/14/2021 11:11 AM  ? ?

## 2021-08-23 ENCOUNTER — Ambulatory Visit: Payer: Medicaid Other | Admitting: Women's Health

## 2021-10-02 ENCOUNTER — Ambulatory Visit (INDEPENDENT_AMBULATORY_CARE_PROVIDER_SITE_OTHER): Payer: Medicaid Other | Admitting: Obstetrics & Gynecology

## 2021-10-02 NOTE — Progress Notes (Unsigned)
    Post Partum Visit Note  Brianna Thornton is a 21 y.o. G5P1021 female who presents for a postpartum visit. She is  12  weeks postpartum following a normal spontaneous vaginal delivery.  I have fully reviewed the prenatal and intrapartum course. The delivery was at 41 gestational weeks.  Anesthesia: epidural. Postpartum course has been good . Baby is doing well. Baby is feeding by breast. Bleeding no bleeding. Bowel function is normal. Bladder function is normal. Patient is sexually active. Contraception method is none. Last intercourse 09/26/21.  Postpartum depression screening: negative, score 1.   The pregnancy intention screening data noted above was reviewed. Potential methods of contraception were discussed. The patient elected to proceed with No data recorded.    Health Maintenance Due  Topic Date Due   COVID-19 Vaccine (1) Never done    The following portions of the patient's history were reviewed and updated as appropriate: allergies, current medications, past family history, past medical history, past social history, past surgical history, and problem list.  Review of Systems Pertinent items are noted in HPI.  Objective:  There were no vitals taken for this visit.   General:  alert, cooperative, and no distress   Breasts:  not indicated  Lungs:   Heart:  regular rate and rhythm  Abdomen: soft, non-tender; bowel sounds normal; no masses,  no organomegaly   Wound   GU exam:  not indicated       Assessment:    There are no diagnoses linked to this encounter.  normal postpartum exam.   Plan:   Essential components of care per ACOG recommendations:  1.  Mood and well being: Patient with negative depression screening today. Reviewed local resources for support.  - Patient tobacco use? No.   - hx of drug use? No.    2. Infant care and feeding:  -Patient currently breastmilk feeding? Yes. Reviewed importance of draining breast regularly to support lactation.  -Social  determinants of health (SDOH) reviewed in EPIC. No concerns  3. Sexuality, contraception and birth spacing - Patient does not want a pregnancy in the next year.  Desired family size is 2 children.  - Reviewed reproductive life planning. Reviewed contraceptive methods based on pt preferences and effectiveness.  Patient desired Hormonal Implant and Hormonal Injection today.   - Discussed birth spacing of 18 months  4. Sleep and fatigue -Encouraged family/partner/community support of 4 hrs of uninterrupted sleep to help with mood and fatigue  5. Physical Recovery  - Discussed patients delivery and complications. She describes her labor as mixed. - Patient had a Vaginal problems after delivery including shoulder dystocia . Patient had a 2nd degree laceration. Perineal healing reviewed. Patient expressed understanding - Patient has urinary incontinence? No. - Patient is safe to resume physical and sexual activity  6.  Health Maintenance - HM due items addressed No -   - Last pap smear No results found for: "DIAGPAP" Pap smear not done at today's visit.  -Breast Cancer screening indicated? No.   7. Chronic Disease/Pregnancy Condition follow up: None  - PCP follow up Adam Phenix, MD  Center for [facility], [facility]

## 2021-11-06 ENCOUNTER — Encounter: Payer: Self-pay | Admitting: Pediatrics

## 2021-12-16 ENCOUNTER — Emergency Department (HOSPITAL_COMMUNITY)
Admission: EM | Admit: 2021-12-16 | Discharge: 2021-12-17 | Disposition: A | Discharge status: Home / Self Care | Payer: Medicaid Other | Attending: Emergency Medicine | Admitting: Emergency Medicine

## 2021-12-16 ENCOUNTER — Encounter (HOSPITAL_COMMUNITY): Payer: Self-pay

## 2021-12-16 DIAGNOSIS — E876 Hypokalemia: Secondary | ICD-10-CM | POA: Diagnosis not present

## 2021-12-16 DIAGNOSIS — R1013 Epigastric pain: Secondary | ICD-10-CM | POA: Insufficient documentation

## 2021-12-16 DIAGNOSIS — R112 Nausea with vomiting, unspecified: Secondary | ICD-10-CM | POA: Insufficient documentation

## 2021-12-16 DIAGNOSIS — R197 Diarrhea, unspecified: Secondary | ICD-10-CM | POA: Insufficient documentation

## 2021-12-16 LAB — CBC
HCT: 38.4 % (ref 36.0–46.0)
Hemoglobin: 12.8 g/dL (ref 12.0–15.0)
MCH: 28.6 pg (ref 26.0–34.0)
MCHC: 33.3 g/dL (ref 30.0–36.0)
MCV: 85.9 fL (ref 80.0–100.0)
Platelets: 298 10*3/uL (ref 150–400)
RBC: 4.47 MIL/uL (ref 3.87–5.11)
RDW: 13.2 % (ref 11.5–15.5)
WBC: 9.2 10*3/uL (ref 4.0–10.5)
nRBC: 0 % (ref 0.0–0.2)

## 2021-12-16 LAB — COMPREHENSIVE METABOLIC PANEL
ALT: 19 U/L (ref 0–44)
AST: 17 U/L (ref 15–41)
Albumin: 3.9 g/dL (ref 3.5–5.0)
Alkaline Phosphatase: 93 U/L (ref 38–126)
Anion gap: 8 (ref 5–15)
BUN: 5 mg/dL — ABNORMAL LOW (ref 6–20)
CO2: 26 mmol/L (ref 22–32)
Calcium: 9.4 mg/dL (ref 8.9–10.3)
Chloride: 110 mmol/L (ref 98–111)
Creatinine, Ser: 0.68 mg/dL (ref 0.44–1.00)
GFR, Estimated: >60 mL/min (ref >60)
Glucose, Bld: 99 mg/dL (ref 70–99)
Potassium: 3.2 mmol/L — ABNORMAL LOW (ref 3.5–5.1)
Sodium: 144 mmol/L (ref 135–145)
Total Bilirubin: 0.6 mg/dL (ref 0.3–1.2)
Total Protein: 6.7 g/dL (ref 6.5–8.1)

## 2021-12-16 LAB — LIPASE, BLOOD: Lipase: 22 U/L (ref 11–51)

## 2021-12-16 NOTE — ED Triage Notes (Signed)
Pt states that she has been having upper abd pain with n/v/d since Thursday. Denies fevers or dysuria

## 2021-12-17 LAB — URINALYSIS, ROUTINE W REFLEX MICROSCOPIC
Bilirubin Urine: NEGATIVE
Glucose, UA: NEGATIVE mg/dL
Hgb urine dipstick: NEGATIVE
Ketones, ur: NEGATIVE mg/dL
Nitrite: NEGATIVE
Protein, ur: NEGATIVE mg/dL
Specific Gravity, Urine: 1.029 (ref 1.005–1.030)
pH: 5 (ref 5.0–8.0)

## 2021-12-17 LAB — I-STAT BETA HCG BLOOD, ED (MC, WL, AP ONLY): I-stat hCG, quantitative: <5 m[IU]/mL (ref <5)

## 2021-12-17 MED ORDER — FAMOTIDINE IN NACL 20-0.9 MG/50ML-% IV SOLN
20.0000 mg | Freq: Once | INTRAVENOUS | Status: AC
  Administered 2021-12-17: 20 mg via INTRAVENOUS
  Filled 2021-12-17: qty 50

## 2021-12-17 MED ORDER — ONDANSETRON HCL 4 MG/2ML IJ SOLN
4.0000 mg | Freq: Once | INTRAMUSCULAR | Status: AC
  Administered 2021-12-17: 4 mg via INTRAVENOUS
  Filled 2021-12-17: qty 2

## 2021-12-17 MED ORDER — ONDANSETRON 4 MG PO TBDP: 4.0000 mg | ORAL_TABLET | Freq: Three times a day (TID) | ORAL | 0 refills | Status: DC | PRN

## 2021-12-17 MED ORDER — OMEPRAZOLE 20 MG PO CPDR: 20.0000 mg | DELAYED_RELEASE_CAPSULE | Freq: Every day | ORAL | 0 refills | Status: DC

## 2021-12-17 MED ORDER — SODIUM CHLORIDE 0.9 % IV BOLUS
1000.0000 mL | Freq: Once | INTRAVENOUS | Status: AC
  Administered 2021-12-17: 1000 mL via INTRAVENOUS

## 2021-12-17 MED ORDER — POTASSIUM CHLORIDE CRYS ER 20 MEQ PO TBCR
40.0000 meq | EXTENDED_RELEASE_TABLET | Freq: Two times a day (BID) | ORAL | Status: DC
  Administered 2021-12-17: 40 meq via ORAL
  Filled 2021-12-17: qty 2

## 2021-12-17 NOTE — ED Provider Notes (Signed)
[facility]ENCY DEPT [facility] Emergency Department Provider Note MRN:  [id number]  Arrival date & time: 12/17/21     Chief Complaint   Abdominal Pain   History of Present Illness   Brianna Thornton is a 21 y.o. year-old female presents to the ED with chief complaint of epigastric abdominal pain.  Reports associated nausea, vomiting, and diarrhea.  She denies any fevers or chills.  She states that she has tried taking some Tylenol without relief.  Onset of symptoms was earlier this week.  She had only 1 day of vomiting 3 days ago.  States that the diarrhea continues.  History provided by patient.   Review of Systems  Pertinent positive and negative review of systems noted in HPI.    Physical Exam   Vitals:   12/17/21 0304 12/17/21 0308  BP:    Pulse:    Resp:    Temp: 97.9 F (36.6 C) 98.7 F (37.1 C)  SpO2:      CONSTITUTIONAL:  well-appearing, NAD NEURO:  Alert and oriented x 3, CN 3-12 grossly intact EYES:  eyes equal and reactive ENT/NECK:  Supple, no stridor  CARDIO:  normal rate, regular rhythm, appears well-perfused  PULM:  No respiratory distress,  GI/GU:  non-distended,  MSK/SPINE:  No gross deformities, no edema, moves all extremities  SKIN:  no rash, atraumatic   *Additional and/or pertinent findings included in MDM below  Diagnostic and Interventional Summary    EKG Interpretation  Date/Time:    Ventricular Rate:    PR Interval:    QRS Duration:   QT Interval:    QTC Calculation:   R Axis:     Text Interpretation:         Labs Reviewed  COMPREHENSIVE METABOLIC PANEL - Abnormal; Notable for the following components:      Result Value   Potassium 3.2 (*)    BUN 5 (*)    All other components within normal limits  URINALYSIS, ROUTINE W REFLEX MICROSCOPIC - Abnormal; Notable for the following components:   APPearance TURBID (*)    Leukocytes,Ua TRACE (*)    Bacteria, UA RARE (*)    All other components within normal limits  LIPASE, BLOOD   CBC  I-STAT BETA HCG BLOOD, ED ([facility], [facility], [facility] ONLY)    No orders to display    Medications  potassium chloride SA (KLOR-CON M) CR tablet 40 mEq (40 mEq Oral Given 12/17/21 0302)  ondansetron (ZOFRAN) injection 4 mg (4 mg Intravenous Given 12/17/21 0159)  famotidine (PEPCID) IVPB 20 mg premix (0 mg Intravenous Stopped 12/17/21 0236)  sodium chloride 0.9 % bolus 1,000 mL (0 mLs Intravenous Stopped 12/17/21 0304)     Procedures  /  Critical Care Procedures  ED Course and Medical Decision Making  I have reviewed the triage vital signs, the nursing notes, and pertinent available records from the EMR.  Social Determinants Affecting Complexity of Care: Patient has no clinically significant social determinants affecting this chief complaint..   ED Course:    Medical Decision Making Patient here with epigastric abdominal pain, nausea, vomiting, diarrhea.  Symptoms started earlier this week.  Vomiting has since resolved.  She continues to have some diarrhea.  She denies any fevers, cough, dysuria.  She has taken some Tylenol without much relief.  I discussed risks and benefits of advanced imaging with the patient, shared decision-making was used.  Do not feel that any additional work-up is indicated at this time.  Vital signs are stable.  Patient  is comfortable.  She is agreeable with plan for outpatient treatment.  Will prescribe some Zofran  Amount and/or Complexity of Data Reviewed Labs: ordered.    Details: Mild hypokalemia noted at 3.2, likely normal in the setting of diarrhea, will but will give 1 dose of oral potassium  Risk Prescription drug management.     Consultants: No consultations were needed in caring for this patient.   Treatment and Plan: Emergency department workup does not suggest an emergent condition requiring admission or immediate intervention beyond  what has been performed at this time. The patient is safe for discharge and has  been instructed to return  immediately for worsening symptoms, change in  symptoms or any other concerns    Final Clinical Impressions(s) / ED Diagnoses     ICD-10-CM   1. Nausea vomiting and diarrhea  R11.2    R19.7       ED Discharge Orders          Ordered    ondansetron (ZOFRAN-ODT) 4 MG disintegrating tablet  Every 8 hours PRN        12/17/21 0242    omeprazole (PRILOSEC) 20 MG capsule  Daily        12/17/21 0242              Discharge Instructions Discussed with and Provided to Patient:   Discharge Instructions   None      Montine Circle, PA-C 12/17/21 0622    Maudie Flakes, MD 12/17/21 [phone]

## 2022-01-24 ENCOUNTER — Telehealth: Payer: Self-pay | Admitting: *Deleted

## 2022-01-24 ENCOUNTER — Encounter: Payer: Self-pay | Admitting: *Deleted

## 2022-01-24 ENCOUNTER — Other Ambulatory Visit: Payer: Self-pay

## 2022-01-24 ENCOUNTER — Encounter (HOSPITAL_COMMUNITY): Admission: EM | Disposition: A | Discharge status: Home / Self Care | Payer: Self-pay | Source: Home / Self Care

## 2022-01-24 ENCOUNTER — Inpatient Hospital Stay (HOSPITAL_COMMUNITY)
Admission: EM | Admit: 2022-01-24 | Discharge: 2022-01-24 | Disposition: A | Discharge status: Home / Self Care | Payer: Medicaid Other | Attending: Obstetrics and Gynecology | Admitting: Obstetrics and Gynecology

## 2022-01-24 ENCOUNTER — Inpatient Hospital Stay (EMERGENCY_DEPARTMENT_HOSPITAL): Payer: Medicaid Other | Admitting: Certified Registered Nurse Anesthetist

## 2022-01-24 ENCOUNTER — Inpatient Hospital Stay (HOSPITAL_COMMUNITY): Payer: Medicaid Other

## 2022-01-24 ENCOUNTER — Inpatient Hospital Stay (HOSPITAL_COMMUNITY): Payer: Medicaid Other | Admitting: Certified Registered Nurse Anesthetist

## 2022-01-24 ENCOUNTER — Encounter (HOSPITAL_COMMUNITY): Payer: Self-pay | Admitting: Emergency Medicine

## 2022-01-24 DIAGNOSIS — K661 Hemoperitoneum: Secondary | ICD-10-CM

## 2022-01-24 DIAGNOSIS — O00101 Right tubal pregnancy without intrauterine pregnancy: Secondary | ICD-10-CM | POA: Insufficient documentation

## 2022-01-24 DIAGNOSIS — Q505 Embryonic cyst of broad ligament: Secondary | ICD-10-CM | POA: Insufficient documentation

## 2022-01-24 DIAGNOSIS — O99511 Diseases of the respiratory system complicating pregnancy, first trimester: Secondary | ICD-10-CM | POA: Insufficient documentation

## 2022-01-24 DIAGNOSIS — J45909 Unspecified asthma, uncomplicated: Secondary | ICD-10-CM | POA: Diagnosis not present

## 2022-01-24 DIAGNOSIS — O081 Delayed or excessive hemorrhage following ectopic and molar pregnancy: Secondary | ICD-10-CM | POA: Diagnosis not present

## 2022-01-24 HISTORY — PX: LAPAROSCOPY: SHX197

## 2022-01-24 LAB — COMPREHENSIVE METABOLIC PANEL
ALT: 11 U/L (ref 0–44)
AST: 13 U/L — ABNORMAL LOW (ref 15–41)
Albumin: 3.8 g/dL (ref 3.5–5.0)
Alkaline Phosphatase: 90 U/L (ref 38–126)
Anion gap: 11 (ref 5–15)
BUN: 6 mg/dL (ref 6–20)
CO2: 20 mmol/L — ABNORMAL LOW (ref 22–32)
Calcium: 8.8 mg/dL — ABNORMAL LOW (ref 8.9–10.3)
Chloride: 109 mmol/L (ref 98–111)
Creatinine, Ser: 0.55 mg/dL (ref 0.44–1.00)
GFR, Estimated: >60 mL/min (ref >60)
Glucose, Bld: 89 mg/dL (ref 70–99)
Potassium: 3.4 mmol/L — ABNORMAL LOW (ref 3.5–5.1)
Sodium: 140 mmol/L (ref 135–145)
Total Bilirubin: 0.3 mg/dL (ref 0.3–1.2)
Total Protein: 6.5 g/dL (ref 6.5–8.1)

## 2022-01-24 LAB — CBC WITH DIFFERENTIAL/PLATELET
Abs Immature Granulocytes: 0.03 10*3/uL (ref 0.00–0.07)
Basophils Absolute: 0 10*3/uL (ref 0.0–0.1)
Basophils Relative: 0 %
Eosinophils Absolute: 0.2 10*3/uL (ref 0.0–0.5)
Eosinophils Relative: 2 %
HCT: 36.6 % (ref 36.0–46.0)
Hemoglobin: 11.8 g/dL — ABNORMAL LOW (ref 12.0–15.0)
Immature Granulocytes: 0 %
Lymphocytes Relative: 20 %
Lymphs Abs: 1.9 10*3/uL (ref 0.7–4.0)
MCH: 28.8 pg (ref 26.0–34.0)
MCHC: 32.2 g/dL (ref 30.0–36.0)
MCV: 89.3 fL (ref 80.0–100.0)
Monocytes Absolute: 0.5 10*3/uL (ref 0.1–1.0)
Monocytes Relative: 5 %
Neutro Abs: 6.9 10*3/uL (ref 1.7–7.7)
Neutrophils Relative %: 73 %
Platelets: 310 10*3/uL (ref 150–400)
RBC: 4.1 MIL/uL (ref 3.87–5.11)
RDW: 13.5 % (ref 11.5–15.5)
WBC: 9.6 10*3/uL (ref 4.0–10.5)
nRBC: 0 % (ref 0.0–0.2)

## 2022-01-24 LAB — WET PREP, GENITAL
Clue Cells Wet Prep HPF POC: NONE SEEN
Sperm: NONE SEEN
Trich, Wet Prep: NONE SEEN
WBC, Wet Prep HPF POC: >=10 — AB (ref <10)
Yeast Wet Prep HPF POC: NONE SEEN

## 2022-01-24 LAB — URINALYSIS, ROUTINE W REFLEX MICROSCOPIC
Bilirubin Urine: NEGATIVE
Glucose, UA: NEGATIVE mg/dL
Hgb urine dipstick: NEGATIVE
Ketones, ur: 80 mg/dL — AB
Leukocytes,Ua: NEGATIVE
Nitrite: NEGATIVE
Protein, ur: NEGATIVE mg/dL
Specific Gravity, Urine: 1.025 (ref 1.005–1.030)
pH: 5 (ref 5.0–8.0)

## 2022-01-24 LAB — ABO/RH: ABO/RH(D): O POS

## 2022-01-24 LAB — LIPASE, BLOOD: Lipase: 22 U/L (ref 11–51)

## 2022-01-24 LAB — I-STAT BETA HCG BLOOD, ED (MC, WL, AP ONLY): I-stat hCG, quantitative: 437.1 m[IU]/mL — ABNORMAL HIGH (ref <5)

## 2022-01-24 LAB — GC/CHLAMYDIA PROBE AMP (~~LOC~~) NOT AT ARMC
Chlamydia: NEGATIVE
Comment: NEGATIVE
Comment: NORMAL
Neisseria Gonorrhea: NEGATIVE

## 2022-01-24 LAB — HCG, QUANTITATIVE, PREGNANCY: hCG, Beta Chain, Quant, S: 415 m[IU]/mL — ABNORMAL HIGH (ref <5)

## 2022-01-24 SURGERY — LAPAROSCOPY, DIAGNOSTIC
Anesthesia: General | Site: Abdomen

## 2022-01-24 MED ORDER — PHENYLEPHRINE HCL-NACL 20-0.9 MG/250ML-% IV SOLN
INTRAVENOUS | Status: DC | PRN
  Administered 2022-01-24: 25 ug/min via INTRAVENOUS

## 2022-01-24 MED ORDER — ONDANSETRON HCL 4 MG/2ML IJ SOLN
INTRAMUSCULAR | Status: DC | PRN
  Administered 2022-01-24: 4 mg via INTRAVENOUS

## 2022-01-24 MED ORDER — PROPOFOL 500 MG/50ML IV EMUL
INTRAVENOUS | Status: DC | PRN
  Administered 2022-01-24: 50 ug/kg/min via INTRAVENOUS

## 2022-01-24 MED ORDER — PROPOFOL 10 MG/ML IV BOLUS
INTRAVENOUS | Status: DC | PRN
  Administered 2022-01-24: 40 mg via INTRAVENOUS
  Administered 2022-01-24: 120 mg via INTRAVENOUS

## 2022-01-24 MED ORDER — FENTANYL CITRATE (PF) 250 MCG/5ML IJ SOLN
INTRAMUSCULAR | Status: DC | PRN
  Administered 2022-01-24: 50 ug via INTRAVENOUS
  Administered 2022-01-24: 100 ug via INTRAVENOUS
  Administered 2022-01-24: 50 ug via INTRAVENOUS

## 2022-01-24 MED ORDER — ONDANSETRON 4 MG PO TBDP
4.0000 mg | ORAL_TABLET | Freq: Once | ORAL | Status: AC
  Administered 2022-01-24: 4 mg via ORAL
  Filled 2022-01-24: qty 1

## 2022-01-24 MED ORDER — BUPIVACAINE HCL (PF) 0.25 % IJ SOLN
INTRAMUSCULAR | Status: DC | PRN
  Administered 2022-01-24: 9 mL

## 2022-01-24 MED ORDER — OXYCODONE-ACETAMINOPHEN 5-325 MG PO TABS: 1.0000 | ORAL_TABLET | Freq: Four times a day (QID) | ORAL | 0 refills | Status: DC | PRN

## 2022-01-24 MED ORDER — DOCUSATE SODIUM 100 MG PO CAPS: 100.0000 mg | ORAL_CAPSULE | Freq: Two times a day (BID) | ORAL | 2 refills | Status: DC | PRN

## 2022-01-24 MED ORDER — POVIDONE-IODINE 10 % EX SWAB: 2.0000 | Freq: Once | CUTANEOUS | Status: DC

## 2022-01-24 MED ORDER — MIDAZOLAM HCL 2 MG/2ML IJ SOLN
INTRAMUSCULAR | Status: DC | PRN
  Administered 2022-01-24: 2 mg via INTRAVENOUS

## 2022-01-24 MED ORDER — DEXMEDETOMIDINE HCL IN NACL 200 MCG/50ML IV SOLN
INTRAVENOUS | Status: DC | PRN
  Administered 2022-01-24: 10 ug via INTRAVENOUS

## 2022-01-24 MED ORDER — MIDAZOLAM HCL 2 MG/2ML IJ SOLN
INTRAMUSCULAR | Status: AC
  Filled 2022-01-24: qty 2

## 2022-01-24 MED ORDER — BUPIVACAINE HCL (PF) 0.25 % IJ SOLN
INTRAMUSCULAR | Status: AC
  Filled 2022-01-24: qty 30

## 2022-01-24 MED ORDER — DEXAMETHASONE SODIUM PHOSPHATE 10 MG/ML IJ SOLN
INTRAMUSCULAR | Status: DC | PRN
  Administered 2022-01-24: 10 mg via INTRAVENOUS

## 2022-01-24 MED ORDER — SUGAMMADEX SODIUM 200 MG/2ML IV SOLN
INTRAVENOUS | Status: DC | PRN
  Administered 2022-01-24: 159.6 mg via INTRAVENOUS

## 2022-01-24 MED ORDER — LACTATED RINGERS IV SOLN: INTRAVENOUS | Status: DC | PRN

## 2022-01-24 MED ORDER — FENTANYL CITRATE (PF) 250 MCG/5ML IJ SOLN
INTRAMUSCULAR | Status: AC
  Filled 2022-01-24: qty 5

## 2022-01-24 MED ORDER — SUCCINYLCHOLINE CHLORIDE 200 MG/10ML IV SOSY
PREFILLED_SYRINGE | INTRAVENOUS | Status: DC | PRN
  Administered 2022-01-24: 60 mg via INTRAVENOUS

## 2022-01-24 MED ORDER — SODIUM CHLORIDE 0.9 % IR SOLN
Status: DC | PRN
  Administered 2022-01-24: 1000 mL

## 2022-01-24 MED ORDER — IBUPROFEN 600 MG PO TABS: 600.0000 mg | ORAL_TABLET | Freq: Four times a day (QID) | ORAL | 3 refills | Status: DC | PRN

## 2022-01-24 MED ORDER — ROCURONIUM BROMIDE 10 MG/ML (PF) SYRINGE
PREFILLED_SYRINGE | INTRAVENOUS | Status: DC | PRN
  Administered 2022-01-24: 40 mg via INTRAVENOUS

## 2022-01-24 MED ORDER — LIDOCAINE 2% (20 MG/ML) 5 ML SYRINGE
INTRAMUSCULAR | Status: DC | PRN
Start: 2022-01-24 — End: 2022-01-24
  Administered 2022-01-24: 40 mg via INTRAVENOUS

## 2022-01-24 SURGICAL SUPPLY — 33 items
ADH SKN CLS APL DERMABOND .7 (GAUZE/BANDAGES/DRESSINGS) ×1
BAG COUNTER SPONGE SURGICOUNT (BAG) ×1 IMPLANT
BAG SPEC RTRVL LRG 6X4 10 (ENDOMECHANICALS) ×1
BAG SPNG CNTER NS LX DISP (BAG) ×1
DERMABOND ADVANCED .7 DNX12 (GAUZE/BANDAGES/DRESSINGS) ×1 IMPLANT
DRSG OPSITE POSTOP 3X4 (GAUZE/BANDAGES/DRESSINGS) IMPLANT
DURAPREP 26ML APPLICATOR (WOUND CARE) ×1 IMPLANT
ELECT REM PT RETURN 9FT ADLT (ELECTROSURGICAL)
ELECTRODE REM PT RTRN 9FT ADLT (ELECTROSURGICAL) IMPLANT
GLOVE BIOGEL PI IND STRL 6.5 (GLOVE) ×2 IMPLANT
GLOVE BIOGEL PI IND STRL 7.0 (GLOVE) ×2 IMPLANT
GLOVE SURG SS PI 6.5 STRL IVOR (GLOVE) ×1 IMPLANT
GOWN STRL REUS W/ TWL LRG LVL3 (GOWN DISPOSABLE) ×2 IMPLANT
GOWN STRL REUS W/TWL LRG LVL3 (GOWN DISPOSABLE) ×2
IRRIG SUCT STRYKERFLOW 2 WTIP (MISCELLANEOUS) ×1
IRRIGATION SUCT STRKRFLW 2 WTP (MISCELLANEOUS) IMPLANT
KIT TURNOVER KIT B (KITS) ×1 IMPLANT
NS IRRIG 1000ML POUR BTL (IV SOLUTION) ×1 IMPLANT
PACK LAPAROSCOPY BASIN (CUSTOM PROCEDURE TRAY) ×1 IMPLANT
PACK TRENDGUARD 450 HYBRID PRO (MISCELLANEOUS) IMPLANT
POUCH SPECIMEN RETRIEVAL 10MM (ENDOMECHANICALS) IMPLANT
PROTECTOR NERVE ULNAR (MISCELLANEOUS) ×2 IMPLANT
SET TUBE SMOKE EVAC HIGH FLOW (TUBING) ×1 IMPLANT
SHEARS HARMONIC ACE PLUS 36CM (ENDOMECHANICALS) IMPLANT
SLEEVE ENDOPATH XCEL 5M (ENDOMECHANICALS) ×1 IMPLANT
SUT MNCRL AB 4-0 PS2 18 (SUTURE) ×1 IMPLANT
SUT VICRYL 0 UR6 27IN ABS (SUTURE) ×2 IMPLANT
TOWEL GREEN STERILE FF (TOWEL DISPOSABLE) ×2 IMPLANT
TRAY FOLEY W/BAG SLVR 14FR (SET/KITS/TRAYS/PACK) ×1 IMPLANT
TRENDGUARD 450 HYBRID PRO PACK (MISCELLANEOUS)
TROCAR BALLN 12MMX100 BLUNT (TROCAR) ×1 IMPLANT
TROCAR XCEL NON-BLD 5MMX100MML (ENDOMECHANICALS) ×1 IMPLANT
WARMER LAPAROSCOPE (MISCELLANEOUS) ×1 IMPLANT

## 2022-01-24 NOTE — Anesthesia Procedure Notes (Signed)
Procedure Name: Intubation Date/Time: 01/24/2022 7:17 AM  Performed by: Minerva Ends, CRNAPre-anesthesia Checklist: Patient identified, Emergency Drugs available, Suction available and Patient being monitored Patient Re-evaluated:Patient Re-evaluated prior to induction Oxygen Delivery Method: Circle system utilized Preoxygenation: Pre-oxygenation with 100% oxygen Induction Type: IV induction Ventilation: Mask ventilation without difficulty Laryngoscope Size: Mac and 3 Grade View: Grade I Tube type: Oral Tube size: 7.0 mm Number of attempts: 1 Airway Equipment and Method: Stylet and Oral airway Placement Confirmation: ETT inserted through vocal cords under direct vision, positive ETCO2 and breath sounds checked- equal and bilateral Secured at: 22 cm Tube secured with: Tape Dental Injury: Teeth and Oropharynx as per pre-operative assessment

## 2022-01-24 NOTE — Transfer of Care (Signed)
Immediate Anesthesia Transfer of Care Note  Patient: Brianna Thornton  Procedure(s) Performed: LAPAROSCOPY DIAGNOSTIC,RIGHT SALPINGECTOMY FOR RUPTURED ECTOPIC (Abdomen)  Patient Location: PACU  Anesthesia Type:General  Level of Consciousness: sedated  Airway & Oxygen Therapy: Patient Spontanous Breathing and Patient connected to face mask oxygen  Post-op Assessment: Report given to RN and Post -op Vital signs reviewed and stable  Post vital signs: Reviewed and stable  Last Vitals:  Vitals Value Taken Time  BP 81/49 01/24/22 0809  Temp    Pulse 80 01/24/22 0811  Resp 9 01/24/22 0811  SpO2 99 % 01/24/22 0811  Vitals shown include unvalidated device data.  Last Pain:  Vitals:   01/24/22 0415  TempSrc: Oral  PainSc:          Complications: No notable events documented.

## 2022-01-24 NOTE — H&P (Signed)
Brianna Thornton is an 21 y.o. female [phone] at [redacted]w[redacted]d by LMP with a ruptured ectopic pregnancy. Patient was unaware she was pregnant. She reports onset of lower abdominal pain yesterday morning while at work. The pain waxed and waned throughout the day. The intensity of the pain increased as the day progressed and patient reports associated nausea. Patient is without any other complaints.   Menstrual History: Patient's last menstrual period was 11/27/2021 (exact date).    Past Medical History:  Diagnosis Date   Asthma    Depression    therapist said bipolar, pt does not want to take meds   Ovarian cyst     Past Surgical History:  Procedure Laterality Date   NO PAST SURGERIES      Family History  Problem Relation Age of Onset   Bipolar disorder Mother    Healthy Father     Social History:  reports that she has never smoked. She has never used smokeless tobacco. She reports that she does not drink alcohol and does not use drugs.  Allergies: No Known Allergies  Medications Prior to Admission  Medication Sig Dispense Refill Last Dose   acetaminophen (TYLENOL) 500 MG tablet Take 2 tablets (1,000 mg total) by mouth every 6 (six) hours as needed (once). (Patient not taking: Reported on 10/02/2021) 30 tablet 0    amLODipine (NORVASC) 5 MG tablet Take 1 tablet (5 mg total) by mouth daily. (Patient not taking: Reported on 10/02/2021) 30 tablet 1    cyclobenzaprine (FLEXERIL) 10 MG tablet Take 1 tablet (10 mg total) by mouth 2 (two) times daily as needed for muscle spasms. (Patient not taking: Reported on 10/02/2021) 20 tablet 0    famotidine (PEPCID) 20 MG tablet Take 1 tablet (20 mg total) by mouth 2 (two) times daily. (Patient not taking: Reported on 07/14/2021) 60 tablet 1    ibuprofen (ADVIL) 100 MG/5ML suspension Take 30 mLs (600 mg total) by mouth every 6 (six) hours. (Patient not taking: Reported on 10/02/2021) 473 mL 1    omeprazole (PRILOSEC) 20 MG capsule Take 1 capsule (20 mg total) by  mouth daily. 30 capsule 0    ondansetron (ZOFRAN-ODT) 4 MG disintegrating tablet Take 1 tablet (4 mg total) by mouth every 8 (eight) hours as needed for nausea or vomiting. 10 tablet 0    Prenatal Vit-Fe Phos-FA-Omega (VITAFOL GUMMIES) 3.33-0.333-34.8 MG CHEW Chew 1 tablet by mouth daily. 90 tablet 11     Review of Systems See pertinent in HPI. All other systems reviewed and non contributory Blood pressure 114/68, pulse 71, temperature 98.2 F (36.8 C), temperature source Oral, resp. rate 18, height 5\' 1"  (1.549 m), weight 39.9 kg, last menstrual period 11/27/2021, SpO2 99 %, currently breastfeeding. Physical Exam Blood pressure 114/68, pulse 71, temperature 98.2 F (36.8 C), temperature source Oral, resp. rate 18, height 5\' 1"  (1.549 m), weight 39.9 kg, last menstrual period 11/27/2021, SpO2 99 %, currently breastfeeding. GENERAL: Well-developed, well-nourished female in no acute distress.  LUNGS: Clear to auscultation bilaterally.  HEART: Regular rate and rhythm. ABDOMEN: Soft, nondistended, lower abdominal tenderness and + guarding PELVIC: Deferred to OR EXTREMITIES: No cyanosis, clubbing, or edema, 2+ distal pulses.  Results for orders placed or performed during the hospital encounter of 01/24/22 (from the past 24 hour(s))  CBC with Differential     Status: Abnormal   Collection Time: 01/24/22  1:14 AM  Result Value Ref Range   WBC 9.6 4.0 - 10.5 K/uL   RBC 4.10 3.87 -  5.11 MIL/uL   Hemoglobin 11.8 (L) 12.0 - 15.0 g/dL   HCT 97.9 89.2 - 11.9 %   MCV 89.3 80.0 - 100.0 fL   MCH 28.8 26.0 - 34.0 pg   MCHC 32.2 30.0 - 36.0 g/dL   RDW 41.7 40.8 - 14.4 %   Platelets 310 150 - 400 K/uL   nRBC 0.0 0.0 - 0.2 %   Neutrophils Relative % 73 %   Neutro Abs 6.9 1.7 - 7.7 K/uL   Lymphocytes Relative 20 %   Lymphs Abs 1.9 0.7 - 4.0 K/uL   Monocytes Relative 5 %   Monocytes Absolute 0.5 0.1 - 1.0 K/uL   Eosinophils Relative 2 %   Eosinophils Absolute 0.2 0.0 - 0.5 K/uL   Basophils  Relative 0 %   Basophils Absolute 0.0 0.0 - 0.1 K/uL   Immature Granulocytes 0 %   Abs Immature Granulocytes 0.03 0.00 - 0.07 K/uL  Comprehensive metabolic panel     Status: Abnormal   Collection Time: 01/24/22  1:14 AM  Result Value Ref Range   Sodium 140 135 - 145 mmol/L   Potassium 3.4 (L) 3.5 - 5.1 mmol/L   Chloride 109 98 - 111 mmol/L   CO2 20 (L) 22 - 32 mmol/L   Glucose, Bld 89 70 - 99 mg/dL   BUN 6 6 - 20 mg/dL   Creatinine, Ser 8.18 0.44 - 1.00 mg/dL   Calcium 8.8 (L) 8.9 - 10.3 mg/dL   Total Protein 6.5 6.5 - 8.1 g/dL   Albumin 3.8 3.5 - 5.0 g/dL   AST 13 (L) 15 - 41 U/L   ALT 11 0 - 44 U/L   Alkaline Phosphatase 90 38 - 126 U/L   Total Bilirubin 0.3 0.3 - 1.2 mg/dL   GFR, Estimated >56 >31 mL/min   Anion gap 11 5 - 15  Lipase, blood     Status: None   Collection Time: 01/24/22  1:14 AM  Result Value Ref Range   Lipase 22 11 - 51 U/L  I-Stat Beta hCG blood, ED ([facility], [facility], [facility] only)     Status: Abnormal   Collection Time: 01/24/22  2:00 AM  Result Value Ref Range   I-stat hCG, quantitative 437.1 (H) <5 mIU/mL   Comment 3          Urinalysis, Routine w reflex microscopic PATH Cytology Cervicovaginal Ancillary Only     Status: Abnormal   Collection Time: 01/24/22  5:08 AM  Result Value Ref Range   Color, Urine YELLOW YELLOW   APPearance HAZY (A) CLEAR   Specific Gravity, Urine 1.025 1.005 - 1.030   pH 5.0 5.0 - 8.0   Glucose, UA NEGATIVE NEGATIVE mg/dL   Hgb urine dipstick NEGATIVE NEGATIVE   Bilirubin Urine NEGATIVE NEGATIVE   Ketones, ur 80 (A) NEGATIVE mg/dL   Protein, ur NEGATIVE NEGATIVE mg/dL   Nitrite NEGATIVE NEGATIVE   Leukocytes,Ua NEGATIVE NEGATIVE  Wet prep, genital     Status: Abnormal   Collection Time: 01/24/22  5:08 AM   Specimen: PATH Cytology Cervicovaginal Ancillary Only  Result Value Ref Range   Yeast Wet Prep HPF POC NONE SEEN NONE SEEN   Trich, Wet Prep NONE SEEN NONE SEEN   Clue Cells Wet Prep HPF POC NONE SEEN NONE SEEN   WBC, Wet Prep  HPF POC >=10 (A) <10   Sperm NONE SEEN     US OB LESS THAN 14 WEEKS WITH OB TRANSVAGINAL  Result Date: 01/24/2022 CLINICAL DATA:  Cramping in early pregnancy. EXAM: OBSTETRIC <14 WK [place] AND TRANSVAGINAL OB US TECHNIQUE: Both transabdominal and transvaginal ultrasound examinations were performed for complete evaluation of the gestation as well as the maternal uterus, adnexal regions, and pelvic cul-de-sac. Transvaginal technique was performed to assess early pregnancy. COMPARISON:  None Available. FINDINGS: Intrauterine gestational sac: None Yolk sac:  Not Visualized. Embryo:  Not Visualized. Cardiac Activity: Not Visualized. Subchorionic hemorrhage:  Does not apply. Maternal uterus/adnexae: Right ovary: Normal containing corpus luteal cyst Left ovary: Normal Other :Within the right adnexa there is a complex mixed-echogenicity mass measuring 3.4 x 2.5 x 3.4 cm. This is separate from the right ovary. Free fluid: A moderate volume of complex free fluid containing diffuse internal echoes. IMPRESSION: 1. No intrauterine gestational sac, yolk sac, or fetal pole identified. 2. There is a complex mixed-echogenicity mass within the right adnexa separate from the right ovary which measures up to 3.4 cm. A moderate amount of complex free fluid containing diffuse internal echoes noted within the pelvis. Imaging findings are concerning for ruptured ectopic pregnancy. 3. Critical Value/emergent results were called by telephone at the time of interpretation on 01/24/2022 at 5:51 am to provider [facility] , who verbally acknowledged these results. Electronically Signed   By: Kerby Moors M.D.   On: 01/24/2022 05:53    Assessment/Plan: 21 yo P1 with radiologic findings consistent with a ruptured ectopic pregnancy - Discussed need for surgical management - Risks, benefits and alternatives were explained including but not limited to risks of bleeding, infection and damage to adjacent organs. Patient verbalized  understanding and all questions were answered. Patient agrees to laparoscopic salpingectomy - OR staff notified  Brianna Thornton 01/24/2022, 6:12 AM

## 2022-01-24 NOTE — ED Provider Triage Note (Addendum)
Emergency Medicine Provider Triage Evaluation Note  Brianna Thornton , a 21 y.o. female  was evaluated in triage.  Pt complains of right lower abdominal/pelvic pain that started this evening after she used the bathroom at work.  States pain in her right pelvis that shoots across her abdomen and into her vaginal area/buttocks.  She states it feels like the last time she had an ovarian cyst that ruptured but that was on her left.  Denies irregular bleeding or discharge.  LMP 11/27/21-- reports she is breastfeeding.  Review of Systems  Positive: Abdominal pain Negative: fever  Physical Exam  BP 119/84 (BP Location: Right Arm)   Pulse 78   Temp 99.3 F (37.4 C) (Oral)   Resp 16   SpO2 99%  Gen:   Awake, no distress   Resp:  Normal effort  MSK:   Moves extremities without difficulty  Other:  Tender RLQ  Medical Decision Making  Medically screening exam initiated at 1:03 AM.  Appropriate orders placed.  Brianna Thornton was informed that the remainder of the evaluation will be completed by another provider, this initial triage assessment does not replace that evaluation, and the importance of remaining in the ED until their evaluation is complete.  Right sided abdominal pain/pelvic pain, feels like prior ruptured ovarian cysts.  She does have low grade fever of 71F and is vomiting in triage.  Will check labs, CT.  3:46 AM Patient hcg is + at 437.  Will cancel CT.  Called [facility]-- transfer there for evaluation.   Larene Pickett, PA-C 01/24/22 0106    Larene Pickett, PA-C 01/24/22 0111    Larene Pickett, PA-C 01/24/22 [phone]

## 2022-01-24 NOTE — Anesthesia Preprocedure Evaluation (Signed)
Anesthesia Evaluation  Patient identified by MRN, date of birth, ID band Patient awake    Reviewed: Allergy & Precautions, NPO status , Patient's Chart, lab work & pertinent test results  Airway Mallampati: I  TM Distance: >3 FB Neck ROM: Full    Dental no notable dental hx. (+) Dental Advisory Given, Teeth Intact   Pulmonary asthma    Pulmonary exam normal breath sounds clear to auscultation       Cardiovascular negative cardio ROS Normal cardiovascular exam Rhythm:Regular Rate:Normal     Neuro/Psych negative neurological ROS     GI/Hepatic negative GI ROS, Neg liver ROS,,,  Endo/Other  negative endocrine ROS    Renal/GU negative Renal ROS     Musculoskeletal negative musculoskeletal ROS (+)    Abdominal  (+) - obese  Peds  Hematology negative hematology ROS (+)   Anesthesia Other Findings   Reproductive/Obstetrics                             Anesthesia Physical Anesthesia Plan  ASA: 1  Anesthesia Plan: General   Post-op Pain Management: Ofirmev IV (intra-op)* and Toradol IV (intra-op)*   Induction: Intravenous, Rapid sequence and Cricoid pressure planned  PONV Risk Score and Plan: 4 or greater and Ondansetron, Dexamethasone, Treatment may vary due to age or medical condition and Midazolam  Airway Management Planned: Oral ETT  Additional Equipment:   Intra-op Plan:   Post-operative Plan: Extubation in OR  Informed Consent: I have reviewed the patients History and Physical, chart, labs and discussed the procedure including the risks, benefits and alternatives for the proposed anesthesia with the patient or authorized representative who has indicated his/her understanding and acceptance.     Dental advisory given  Plan Discussed with: CRNA  Anesthesia Plan Comments:        Anesthesia Quick Evaluation

## 2022-01-24 NOTE — Op Note (Signed)
Brianna Thornton PROCEDURE DATE: 01/24/2022  PREOPERATIVE DIAGNOSIS: Ruptured ectopic pregnancy POSTOPERATIVE DIAGNOSIS: Ruptured right fallopian tube ectopic pregnancy PROCEDURE: Laparoscopic right salpingectomy and removal of ectopic pregnancy SURGEON:  Dr. Mora Bellman ASSISTANT: none ANESTHESIOLOGIST: Brianna Nations, MD Anesthesiologist: Brianna Nations, MD CRNA: Brianna Ends, CRNA  INDICATIONS: 21 y.o. [phone] at [redacted]w[redacted]d here for with ruptured ectopic pregnancy. On exam, she had stable vital signs, and an acute abdomen. Hgb  Lab Results  Component Value Date   HGB 11.8 (L) 01/24/2022   , blood type O POS . Patient was counseled regarding need for laparoscopic salpingectomy. Risks of surgery including bleeding which may require transfusion or reoperation, infection, injury to bowel or other surrounding organs, need for additional procedures including laparotomy and other postoperative/anesthesia complications were explained to patient.  Written informed consent was obtained.  FINDINGS:  Moderate amount of hemoperitoneum estimated to be about 100 of blood and clots.  Dilated right fallopian tube containing ectopic gestation. Small normal appearing uterus, normal left fallopian tube, right ovary and left ovary.  ANESTHESIA: General INTRAVENOUS FLUIDS: .900 ml ESTIMATED BLOOD LOSS:  100 mL hemoperitoneum URINE OUTPUT: 150 ml SPECIMENS: right fallopian tube containing ectopic gestation COMPLICATIONS: None immediate  PROCEDURE IN DETAIL:  The patient was taken to the operating room where general anesthesia was administered and was found to be adequate.  She was placed in the dorsal lithotomy position, and was prepped and draped in a sterile manner.  A Foley catheter was inserted into her bladder and attached to Brianna Thornton drainage and a uterine manipulator was then advanced into the uterus .  After an adequate timeout was performed, attention was then turned to the patient's abdomen where a 10-mm  skin incision was made on the umbilical fold.  The fascia was identified, tented up and incised with Brianna Thornton. The fascia was tagged with 0- Vicryl. The peritoneum was identified tented up and entered sharply with Brianna Thornton.  10-mm trocar and sleeve were then advanced without difficulty into the abdomen where intraabdominal placement was confirmed by the laparoscope. Pneumoperitoneum was achieved with insufflation of CO2 gas. A survey of the patient's pelvis and abdomen revealed the findings as above.  Two left lower quadrant 5-mm ports were placed under direct visualization.  The 5-mm Brianna Thornton was then used to suction the hemoperitoneum and irrigate the pelvis.  Attention was then turned to the right fallopian tube which was grasped and ligated from the underlying mesosalpinx and uterine attachment using the Brianna Thornton.  Good hemostasis was noted.  The specimen was placed in an EndoCatch bag and removed from the abdomen intact.  The abdomen was desufflated, and all instruments were removed.  The fascial incision at the 10-mm site was reapproximated with 0 Vicryl figure-of-eight stiches; and all skin incisions were closed with a 3-0 Vicryl subcuticular stitch. The patient tolerated the procedures well.  All instruments, needles, and sponge counts were correct x 2. The patient was taken to the recovery room in stable condition.    The patient will be discharged to home as per PACU criteria.  Routine postoperative instructions given.  She was prescribed Brianna Thornton, Brianna Thornton and Brianna Thornton.  She will follow up in the clinic in 2 weeks for postoperative evaluation .

## 2022-01-24 NOTE — MAU Provider Note (Signed)
History     CSN: [id number]  Arrival date and time: 01/24/22 0040   Event Date/Time   First Provider Initiated Contact with Patient 01/24/22 0436      Chief Complaint  Patient presents with   Abdominal Pain   Nyala Kirchner , a  21 y.o. [phone] at [redacted]w[redacted]d presents to MAU as a ED transfer for right lower quadrant abdominal pain that started earlier this afternoon while at work. Patient states at first she thought it may have been her period starting but pain progressed quickly to 10/10 with violent bouts of nausea and vomiting. Patient states she attempted to take Tylenol without relief. Upon presentation to ED she was notified that she was pregnanct and transferred for ectopic workup. She was given PO Zofran in [facility], and nausea and vomiting has ceased. Upon arrival to MAU, pain now a 1 and intermittent. She denies vaginal bleeding, abnormal vaginal discharge, burning and irritation.       Abdominal Pain Pertinent negatives include no constipation, diarrhea, dysuria, fever, headaches, nausea or vomiting.    OB History     Gravida  4   Para  1   Term  1   Preterm      AB  2   Living  1      SAB  2   IAB      Ectopic      Multiple  0   Live Births  1           Past Medical History:  Diagnosis Date   Asthma    Depression    therapist said bipolar, pt does not want to take meds   Ovarian cyst     Past Surgical History:  Procedure Laterality Date   NO PAST SURGERIES      Family History  Problem Relation Age of Onset   Bipolar disorder Mother    Healthy Father     Social History   Tobacco Use   Smoking status: Never   Smokeless tobacco: Never  Vaping Use   Vaping Use: Never used  Substance Use Topics   Alcohol use: No   Drug use: Never    Allergies: No Known Allergies  Medications Prior to Admission  Medication Sig Dispense Refill Last Dose   acetaminophen (TYLENOL) 500 MG tablet Take 2 tablets (1,000 mg total) by mouth every 6 (six) hours  as needed (once). (Patient not taking: Reported on 10/02/2021) 30 tablet 0    amLODipine (NORVASC) 5 MG tablet Take 1 tablet (5 mg total) by mouth daily. (Patient not taking: Reported on 10/02/2021) 30 tablet 1    cyclobenzaprine (FLEXERIL) 10 MG tablet Take 1 tablet (10 mg total) by mouth 2 (two) times daily as needed for muscle spasms. (Patient not taking: Reported on 10/02/2021) 20 tablet 0    famotidine (PEPCID) 20 MG tablet Take 1 tablet (20 mg total) by mouth 2 (two) times daily. (Patient not taking: Reported on 07/14/2021) 60 tablet 1    ibuprofen (ADVIL) 100 MG/5ML suspension Take 30 mLs (600 mg total) by mouth every 6 (six) hours. (Patient not taking: Reported on 10/02/2021) 473 mL 1    omeprazole (PRILOSEC) 20 MG capsule Take 1 capsule (20 mg total) by mouth daily. 30 capsule 0    ondansetron (ZOFRAN-ODT) 4 MG disintegrating tablet Take 1 tablet (4 mg total) by mouth every 8 (eight) hours as needed for nausea or vomiting. 10 tablet 0    Prenatal Vit-Fe Phos-FA-Omega (VITAFOL GUMMIES) 3.33-0.333-34.8 MG  CHEW Chew 1 tablet by mouth daily. 90 tablet 11     Review of Systems  Constitutional:  Negative for chills, fatigue and fever.  Eyes:  Negative for pain and visual disturbance.  Respiratory:  Negative for apnea, shortness of breath and wheezing.   Cardiovascular:  Negative for chest pain and palpitations.  Gastrointestinal:  Positive for abdominal pain. Negative for constipation, diarrhea, nausea and vomiting.  Genitourinary:  Negative for difficulty urinating, dysuria, pelvic pain, vaginal bleeding, vaginal discharge and vaginal pain.  Musculoskeletal:  Negative for back pain.  Neurological:  Negative for seizures, weakness and headaches.  Psychiatric/Behavioral:  Negative for suicidal ideas.    Physical Exam   Blood pressure 114/68, pulse 71, temperature 98.2 F (36.8 C), temperature source Oral, resp. rate 18, height 5\' 1"  (1.549 m), weight 39.9 kg, last menstrual period 11/27/2021,  SpO2 99 %, currently breastfeeding.  Physical Exam Vitals and nursing note reviewed.  Constitutional:      General: She is not in acute distress.    Appearance: Normal appearance.  HENT:     Head: Normocephalic.  Cardiovascular:     Rate and Rhythm: Normal rate.  Pulmonary:     Effort: Pulmonary effort is normal.  Abdominal:     General: Abdomen is flat.     Palpations: Abdomen is soft.     Tenderness: There is no abdominal tenderness.  Musculoskeletal:     Cervical back: Normal range of motion.  Skin:    General: Skin is warm and dry.     Capillary Refill: Capillary refill takes less than 2 seconds.  Neurological:     Mental Status: She is alert and oriented to person, place, and time.  Psychiatric:        Mood and Affect: Mood normal.     MAU Course  Procedures Orders Placed This Encounter  Procedures   Wet prep, genital   01/27/2022 OB LESS THAN 14 WEEKS WITH OB TRANSVAGINAL   CBC with Differential   Comprehensive metabolic panel   Lipase, blood   Urinalysis, Routine w reflex microscopic   hCG, quantitative, pregnancy   I-Stat Beta hCG blood, ED ([facility], [facility], [facility] only)   ABO/Rh   Results for orders placed or performed during the hospital encounter of 01/24/22 (from the past 24 hour(s))  CBC with Differential     Status: Abnormal   Collection Time: 01/24/22  1:14 AM  Result Value Ref Range   WBC 9.6 4.0 - 10.5 K/uL   RBC 4.10 3.87 - 5.11 MIL/uL   Hemoglobin 11.8 (L) 12.0 - 15.0 g/dL   HCT 13/01/23 16.0 - 10.9 %   MCV 89.3 80.0 - 100.0 fL   MCH 28.8 26.0 - 34.0 pg   MCHC 32.2 30.0 - 36.0 g/dL   RDW 32.3 55.7 - 32.2 %   Platelets 310 150 - 400 K/uL   nRBC 0.0 0.0 - 0.2 %   Neutrophils Relative % 73 %   Neutro Abs 6.9 1.7 - 7.7 K/uL   Lymphocytes Relative 20 %   Lymphs Abs 1.9 0.7 - 4.0 K/uL   Monocytes Relative 5 %   Monocytes Absolute 0.5 0.1 - 1.0 K/uL   Eosinophils Relative 2 %   Eosinophils Absolute 0.2 0.0 - 0.5 K/uL   Basophils Relative 0 %   Basophils Absolute  0.0 0.0 - 0.1 K/uL   Immature Granulocytes 0 %   Abs Immature Granulocytes 0.03 0.00 - 0.07 K/uL  Comprehensive metabolic panel     Status:  Abnormal   Collection Time: 01/24/22  1:14 AM  Result Value Ref Range   Sodium 140 135 - 145 mmol/L   Potassium 3.4 (L) 3.5 - 5.1 mmol/L   Chloride 109 98 - 111 mmol/L   CO2 20 (L) 22 - 32 mmol/L   Glucose, Bld 89 70 - 99 mg/dL   BUN 6 6 - 20 mg/dL   Creatinine, Ser 6.26 0.44 - 1.00 mg/dL   Calcium 8.8 (L) 8.9 - 10.3 mg/dL   Total Protein 6.5 6.5 - 8.1 g/dL   Albumin 3.8 3.5 - 5.0 g/dL   AST 13 (L) 15 - 41 U/L   ALT 11 0 - 44 U/L   Alkaline Phosphatase 90 38 - 126 U/L   Total Bilirubin 0.3 0.3 - 1.2 mg/dL   GFR, Estimated >94 >85 mL/min   Anion gap 11 5 - 15  Lipase, blood     Status: None   Collection Time: 01/24/22  1:14 AM  Result Value Ref Range   Lipase 22 11 - 51 U/L  I-Stat Beta hCG blood, ED ([facility], [facility], [facility] only)     Status: Abnormal   Collection Time: 01/24/22  2:00 AM  Result Value Ref Range   I-stat hCG, quantitative 437.1 (H) <5 mIU/mL   Comment 3          Urinalysis, Routine w reflex microscopic PATH Cytology Cervicovaginal Ancillary Only     Status: Abnormal   Collection Time: 01/24/22  5:08 AM  Result Value Ref Range   Color, Urine YELLOW YELLOW   APPearance HAZY (A) CLEAR   Specific Gravity, Urine 1.025 1.005 - 1.030   pH 5.0 5.0 - 8.0   Glucose, UA NEGATIVE NEGATIVE mg/dL   Hgb urine dipstick NEGATIVE NEGATIVE   Bilirubin Urine NEGATIVE NEGATIVE   Ketones, ur 80 (A) NEGATIVE mg/dL   Protein, ur NEGATIVE NEGATIVE mg/dL   Nitrite NEGATIVE NEGATIVE   Leukocytes,Ua NEGATIVE NEGATIVE  Wet prep, genital     Status: Abnormal   Collection Time: 01/24/22  5:08 AM   Specimen: PATH Cytology Cervicovaginal Ancillary Only  Result Value Ref Range   Yeast Wet Prep HPF POC NONE SEEN NONE SEEN   Trich, Wet Prep NONE SEEN NONE SEEN   Clue Cells Wet Prep HPF POC NONE SEEN NONE SEEN   WBC, Wet Prep HPF POC >=10 (A) <10   Sperm NONE  SEEN    US OB LESS THAN 14 WEEKS WITH OB TRANSVAGINAL  Result Date: 01/24/2022 CLINICAL DATA:  Cramping in early pregnancy. EXAM: OBSTETRIC <14 WK [place] AND TRANSVAGINAL OB US TECHNIQUE: Both transabdominal and transvaginal ultrasound examinations were performed for complete evaluation of the gestation as well as the maternal uterus, adnexal regions, and pelvic cul-de-sac. Transvaginal technique was performed to assess early pregnancy. COMPARISON:  None Available. FINDINGS: Intrauterine gestational sac: None Yolk sac:  Not Visualized. Embryo:  Not Visualized. Cardiac Activity: Not Visualized. Subchorionic hemorrhage:  Does not apply. Maternal uterus/adnexae: Right ovary: Normal containing corpus luteal cyst Left ovary: Normal Other :Within the right adnexa there is a complex mixed-echogenicity mass measuring 3.4 x 2.5 x 3.4 cm. This is separate from the right ovary. Free fluid: A moderate volume of complex free fluid containing diffuse internal echoes. IMPRESSION: 1. No intrauterine gestational sac, yolk sac, or fetal pole identified. 2. There is a complex mixed-echogenicity mass within the right adnexa separate from the right ovary which measures up to 3.4 cm. A moderate amount of complex free fluid containing diffuse internal  echoes noted within the pelvis. Imaging findings are concerning for ruptured ectopic pregnancy. 3. Critical Value/emergent results were called by telephone at the time of interpretation on 01/24/2022 at 5:51 am to provider [facility] , who verbally acknowledged these results. Electronically Signed   By: Kerby Moors M.D.   On: 01/24/2022 05:53    MDM - ED lab results reviewed.  - Remaining ectopic labs and [place] ordered - Rad notified CNM via phone, of Right mass on adnexa with moderate amount of free fluid in pelvic concerning for ectopic pregnancy.  - Dr. Elly Modena notified of Radiology report and is coming to patient bedside to discuss management.  - Repeat CBC ordered.  -  Patient prepped for OR.  - Transfer of care over to Dr. Elly Modena.    Assessment and Plan  - RN given report. Please defer to Dr. Elly Modena for patient management.   Jacquiline Doe, MSN CNM  01/24/2022, 5:55 AM

## 2022-01-24 NOTE — MAU Note (Addendum)
.  Brianna Thornton is a 21 y.o. at Unknown here in MAU reporting: she had cramping pain 10/10 that began while she was at work yesterday around 1500. The pain was in her right lower abdomen and would shoot to the middle of her abdomen. The pain continued throughout the night and stopped while she was waiting in the ED to be called back. She also reports nausea and vomiting and reports 7-8 episodes of emesis in the last 24 hours. She denies nausea at this time. Denies VB or LOF. LMP: 11/27/2021 Onset of complaint: Yesterday Pain score: 1/10 abdominal discomfort Vitals:   01/24/22 0053 01/24/22 0307  BP: 119/84 119/69  Pulse: 78 82  Resp: 16 17  Temp: 99.3 F (37.4 C) 98.6 F (37 C)  SpO2: 99% 99%     FHT:N/A Lab orders placed from triage:  UA

## 2022-01-24 NOTE — Telephone Encounter (Signed)
TC from pt just discharged SP SX for ectopic pregnancy. Pt advised by hospital staff to request work note from office staff. Pt is under our care and surgery was performed by Dr. Elly Modena. Pt left work early due to pain 01/23/22 and had SX in the early AM today with expected f/u in 2 weeks. Letter with work excuse 01/23/22 pm through 01/28/22 placed in chart for pt. Emotional support and condolences for loss offered.

## 2022-01-24 NOTE — Anesthesia Postprocedure Evaluation (Signed)
Anesthesia Post Note  Patient: Brianna Thornton  Procedure(s) Performed: LAPAROSCOPY DIAGNOSTIC,RIGHT SALPINGECTOMY FOR RUPTURED ECTOPIC (Abdomen)     Patient location during evaluation: PACU Anesthesia Type: General Level of consciousness: sedated and patient cooperative Pain management: pain level controlled Vital Signs Assessment: post-procedure vital signs reviewed and stable Respiratory status: spontaneous breathing Cardiovascular status: stable Anesthetic complications: no   No notable events documented.  Last Vitals:  Vitals:   01/24/22 0900 01/24/22 0915  BP: (!) 101/59 102/61  Pulse: 82 69  Resp: 14 10  Temp:  36.7 C  SpO2: 99% 99%    Last Pain:  Vitals:   01/24/22 0830  TempSrc:   PainSc: [place]

## 2022-01-24 NOTE — ED Notes (Signed)
Report given to MAU RN on patient's status / transfer.

## 2022-01-24 NOTE — ED Triage Notes (Signed)
Patient reports RLQ abdominal pain with emesis onset this evening , no diarrhea or fever .

## 2022-01-24 NOTE — MAU Note (Signed)
Patient transported to Short Stay 

## 2022-01-25 ENCOUNTER — Encounter (HOSPITAL_COMMUNITY): Payer: Self-pay | Admitting: Obstetrics and Gynecology

## 2022-01-26 LAB — SURGICAL PATHOLOGY

## 2022-02-05 ENCOUNTER — Ambulatory Visit (INDEPENDENT_AMBULATORY_CARE_PROVIDER_SITE_OTHER): Payer: Medicaid Other | Admitting: Obstetrics and Gynecology

## 2022-02-05 ENCOUNTER — Encounter: Payer: Self-pay | Admitting: Obstetrics and Gynecology

## 2022-02-05 VITALS — BP 118/71 | HR 76 | Wt 92.0 lb

## 2022-02-05 DIAGNOSIS — Z9889 Other specified postprocedural states: Secondary | ICD-10-CM | POA: Diagnosis not present

## 2022-02-05 DIAGNOSIS — Z30014 Encounter for initial prescription of intrauterine contraceptive device: Secondary | ICD-10-CM

## 2022-02-05 DIAGNOSIS — Z3009 Encounter for other general counseling and advice on contraception: Secondary | ICD-10-CM

## 2022-02-05 DIAGNOSIS — Z30013 Encounter for initial prescription of injectable contraceptive: Secondary | ICD-10-CM

## 2022-02-05 LAB — POCT URINE PREGNANCY: Preg Test, Ur: NEGATIVE

## 2022-02-05 MED ORDER — MEDROXYPROGESTERONE ACETATE 150 MG/ML IM SUSP
150.0000 mg | Freq: Once | INTRAMUSCULAR | Status: AC
  Administered 2022-02-05: 150 mg via INTRAMUSCULAR

## 2022-02-05 MED ORDER — MEDROXYPROGESTERONE ACETATE 150 MG/ML IM SUSP: 150.0000 mg | INTRAMUSCULAR | 0 refills | Status: DC

## 2022-02-05 NOTE — Progress Notes (Signed)
21 yo P1 here for post op check following laparoscopic salpingectomy for the treatment of a ruptured ectopic pregnancy. Patient reports feeling well since her procedure. She denies fever or drainage from her incision. She denies any pain. Patient is interested in contraceptions. She is still breastfeed and desires Depo- vs Nexplanon  Past Medical History:  Diagnosis Date   Asthma    Depression    therapist said bipolar, pt does not want to take meds   Ovarian cyst    Past Surgical History:  Procedure Laterality Date   LAPAROSCOPY N/A 01/24/2022   Procedure: LAPAROSCOPY DIAGNOSTIC,RIGHT SALPINGECTOMY FOR RUPTURED ECTOPIC;  Surgeon: [name], MD;  Location: [facility];  Service: Gynecology;  Laterality: N/A;   NO PAST SURGERIES     Family History  Problem Relation Age of Onset   Bipolar disorder Mother    Healthy Father    Social History   Tobacco Use   Smoking status: Never   Smokeless tobacco: Never  Vaping Use   Vaping Use: Never used  Substance Use Topics   Alcohol use: No   Drug use: Never   ROS See pertinent in HPI. All other systems reviewed and non contributory Blood pressure 118/71, pulse 76, weight 92 lb (41.7 kg), last menstrual period 11/27/2021, unknown if currently breastfeeding.  GENERAL: Well-developed, well-nourished female in no acute distress.  HEENT: Normocephalic, atraumatic. Sclerae anicteric.  LUNGS: Clear to auscultation bilaterally.  HEART: Regular rate and rhythm. ABDOMEN: Soft, nontender, nondistended. No organomegaly. Incision sites healed well x 3 PELVIC: Not indicated EXTREMITIES: No cyanosis, clubbing, or edema, 2+ distal pulses.  A/P 21 yo here for post op check - Pathology results reviewed with  the patient - Patient is medically cleared to resume all activities of daily living - Reviewed birth control options with patient. Patient opted for depo-provera - RTC in 1 year for annual exam with pap smear

## 2022-02-05 NOTE — Progress Notes (Signed)
Pt states she is doing well post op. Pt would like to discuss BC options.

## 2022-05-01 ENCOUNTER — Ambulatory Visit: Payer: Medicaid Other

## 2022-05-02 ENCOUNTER — Ambulatory Visit (INDEPENDENT_AMBULATORY_CARE_PROVIDER_SITE_OTHER): Payer: Medicaid Other

## 2022-05-02 DIAGNOSIS — Z3042 Encounter for surveillance of injectable contraceptive: Secondary | ICD-10-CM

## 2022-05-02 MED ORDER — MEDROXYPROGESTERONE ACETATE 150 MG/ML IM SUSP
150.0000 mg | INTRAMUSCULAR | Status: DC
  Administered 2022-05-02: 150 mg via INTRAMUSCULAR

## 2022-05-02 NOTE — Progress Notes (Signed)
Pt is in the office for depo injection, administered in [place] and pt tolerated well. Next due April 25-May 9. .. Administrations This Visit     medroxyPROGESTERone (DEPO-PROVERA) injection 150 mg     Admin Date 05/02/2022 Action Given Dose 150 mg Route Intramuscular Administered By Hinton Lovely, RN

## 2022-07-24 ENCOUNTER — Ambulatory Visit: Payer: Medicaid Other

## 2022-07-24 ENCOUNTER — Other Ambulatory Visit: Payer: Self-pay | Admitting: Obstetrics and Gynecology

## 2022-12-04 ENCOUNTER — Encounter: Payer: Self-pay | Admitting: Pediatrics

## 2023-02-19 ENCOUNTER — Other Ambulatory Visit: Payer: Self-pay

## 2023-02-19 ENCOUNTER — Encounter (HOSPITAL_COMMUNITY): Payer: Self-pay

## 2023-02-19 ENCOUNTER — Emergency Department (HOSPITAL_COMMUNITY)
Admission: EM | Admit: 2023-02-19 | Discharge: 2023-02-19 | Disposition: A | Discharge status: Home / Self Care | Payer: Medicaid Other | Attending: Emergency Medicine | Admitting: Emergency Medicine

## 2023-02-19 ENCOUNTER — Emergency Department (HOSPITAL_COMMUNITY): Payer: Medicaid Other

## 2023-02-19 DIAGNOSIS — J45909 Unspecified asthma, uncomplicated: Secondary | ICD-10-CM | POA: Insufficient documentation

## 2023-02-19 DIAGNOSIS — N939 Abnormal uterine and vaginal bleeding, unspecified: Secondary | ICD-10-CM

## 2023-02-19 DIAGNOSIS — R102 Pelvic and perineal pain: Secondary | ICD-10-CM | POA: Diagnosis present

## 2023-02-19 LAB — COMPREHENSIVE METABOLIC PANEL
ALT: 12 U/L (ref 0–44)
AST: 15 U/L (ref 15–41)
Albumin: 4.2 g/dL (ref 3.5–5.0)
Alkaline Phosphatase: 68 U/L (ref 38–126)
Anion gap: 8 (ref 5–15)
BUN: 6 mg/dL (ref 6–20)
CO2: 25 mmol/L (ref 22–32)
Calcium: 9.1 mg/dL (ref 8.9–10.3)
Chloride: 107 mmol/L (ref 98–111)
Creatinine, Ser: 0.69 mg/dL (ref 0.44–1.00)
GFR, Estimated: >60 mL/min (ref >60)
Glucose, Bld: 123 mg/dL — ABNORMAL HIGH (ref 70–99)
Potassium: 3.3 mmol/L — ABNORMAL LOW (ref 3.5–5.1)
Sodium: 140 mmol/L (ref 135–145)
Total Bilirubin: 0.9 mg/dL (ref <1.2)
Total Protein: 6.9 g/dL (ref 6.5–8.1)

## 2023-02-19 LAB — WET PREP, GENITAL
Clue Cells Wet Prep HPF POC: NONE SEEN
Sperm: NONE SEEN
Trich, Wet Prep: NONE SEEN
WBC, Wet Prep HPF POC: <10 (ref <10)
Yeast Wet Prep HPF POC: NONE SEEN

## 2023-02-19 LAB — CBC
HCT: 40.2 % (ref 36.0–46.0)
Hemoglobin: 13.1 g/dL (ref 12.0–15.0)
MCH: 28.4 pg (ref 26.0–34.0)
MCHC: 32.6 g/dL (ref 30.0–36.0)
MCV: 87 fL (ref 80.0–100.0)
Platelets: 320 10*3/uL (ref 150–400)
RBC: 4.62 MIL/uL (ref 3.87–5.11)
RDW: 13 % (ref 11.5–15.5)
WBC: 8.8 10*3/uL (ref 4.0–10.5)
nRBC: 0 % (ref 0.0–0.2)

## 2023-02-19 LAB — HCG, SERUM, QUALITATIVE: Preg, Serum: NEGATIVE

## 2023-02-19 LAB — LIPASE, BLOOD: Lipase: 19 U/L (ref 11–51)

## 2023-02-19 MED ORDER — POTASSIUM CHLORIDE CRYS ER 20 MEQ PO TBCR
40.0000 meq | EXTENDED_RELEASE_TABLET | Freq: Once | ORAL | Status: AC
  Administered 2023-02-19: 40 meq via ORAL
  Filled 2023-02-19: qty 2

## 2023-02-19 NOTE — ED Triage Notes (Addendum)
Pt to ED via pov. Pt states she had an ectopic pregnancy in October. Pt states she started having pain in abdomen and vaginal bleeding yesterday. Pt states her PCP sent her to ED to r/o another ectopic pregnancy. Pt denies pain at this time. Pt states she is using 1-2 pads a day. Pt states she would also liked to be checked for STDs. Pt denies urinary symptoms.

## 2023-02-19 NOTE — ED Provider Notes (Signed)
[place] EMERGENCY DEPARTMENT AT [facility] Provider Note   CSN: [id number] Arrival date & time: 02/19/23  1410     History  Chief Complaint  Patient presents with  . Vaginal Bleeding    Brianna Thornton is a 22 y.o. female with past medical history significant for asthma, depression, previous ruptured right tubal ectopic pregnancy who presents with concern for lower abdominal pain, vaginal bleeding started yesterday.  Patient reports that she is sexually active, she reports the pain is not similar to her previous ectopic but was sent by her PCP given her bleeding, she thinks that she could be passing some kind of tissue.  She denies any vaginal discharge, pain with sex.  She reports that she feels kind of tired but otherwise denies any other symptoms.  She is requesting STI testing but denies any active symptoms.   Vaginal Bleeding      Home Medications Prior to Admission medications   Medication Sig Start Date End Date Taking? Authorizing Provider  docusate sodium (COLACE) 100 MG capsule Take 1 capsule (100 mg total) by mouth 2 (two) times daily as needed. 01/24/22   Constant, [name], MD  ibuprofen (ADVIL) 600 MG tablet Take 1 tablet (600 mg total) by mouth every 6 (six) hours as needed. 01/24/22   Constant, [name], MD  medroxyPROGESTERone (DEPO-PROVERA) 150 MG/ML injection Inject 1 mL (150 mg total) into the muscle every 3 (three) months. 02/05/22   Constant, [name], MD  omeprazole (PRILOSEC) 20 MG capsule Take 1 capsule (20 mg total) by mouth daily. 12/17/21   Roxy Horseman, PA-C  ondansetron (ZOFRAN-ODT) 4 MG disintegrating tablet Take 1 tablet (4 mg total) by mouth every 8 (eight) hours as needed for nausea or vomiting. 12/17/21   Roxy Horseman, PA-C  oxyCODONE-acetaminophen (PERCOCET/ROXICET) 5-325 MG tablet Take 1 tablet by mouth every 6 (six) hours as needed. 01/24/22   Constant, [name], MD  Prenatal Vit-Fe Phos-FA-Omega (VITAFOL GUMMIES) 3.33-0.333-34.8 MG CHEW Chew  1 tablet by mouth daily. 01/12/21   Constant, [name], MD      Allergies    Patient has no known allergies.    Review of Systems   Review of Systems  Genitourinary:  Positive for vaginal bleeding.  All other systems reviewed and are negative.   Physical Exam Updated Vital Signs BP (!) 89/47   Pulse 76   Temp 98.2 F (36.8 C)   Resp 17   Ht 5\' 1"  (1.549 m)   Wt 42 kg   LMP 02/19/2023   SpO2 100%   BMI 17.50 kg/m  Physical Exam Vitals and nursing note reviewed.  Constitutional:      General: She is not in acute distress.    Appearance: Normal appearance.  HENT:     Head: Normocephalic and atraumatic.  Eyes:     General:        Right eye: No discharge.        Left eye: No discharge.  Cardiovascular:     Rate and Rhythm: Normal rate and regular rhythm.     Heart sounds: No murmur heard.    No friction rub. No gallop.  Pulmonary:     Effort: Pulmonary effort is normal.     Breath sounds: Normal breath sounds.  Abdominal:     General: Bowel sounds are normal.     Palpations: Abdomen is soft.     Comments: Mild tenderness to palpation in the right lower quadrant with deep palpation alone.  No focal mass palpated.  No other tenderness palpation throughout lower abdomen.  Skin:    General: Skin is warm and dry.     Capillary Refill: Capillary refill takes less than 2 seconds.  Neurological:     Mental Status: She is alert and oriented to person, place, and time.  Psychiatric:        Mood and Affect: Mood normal.        Behavior: Behavior normal.     ED Results / Procedures / Treatments   Labs (all labs ordered are listed, but only abnormal results are displayed) Labs Reviewed  COMPREHENSIVE METABOLIC PANEL - Abnormal; Notable for the following components:      Result Value   Potassium 3.3 (*)    Glucose, Bld 123 (*)    All other components within normal limits  WET PREP, GENITAL  LIPASE, BLOOD  CBC  HCG, SERUM, QUALITATIVE  URINALYSIS, ROUTINE W REFLEX  MICROSCOPIC  GC/CHLAMYDIA PROBE AMP ([place]) NOT AT [facility]    EKG None  Radiology No results found.  Procedures Procedures    Medications Ordered in ED Medications - No data to display  ED Course/ Medical Decision Making/ A&P Clinical Course as of 02/19/23 1846  Tue Feb 19, 2023  1655 Blood pressure rechecked due to very low initial reading, when sitting up in bed she is 102/ 65, she is alert, active, denies lightheadedness, chest pain, heart palpitations. [CP]    Clinical Course User Index [CP] Olene Floss, PA-C                                 Medical Decision Making Amount and/or Complexity of Data Reviewed Labs: ordered. Radiology: ordered.   This patient is a 22 y.o. female  who presents to the ED for concern of pelvic pain, vaginal bleeding.   Differential diagnoses prior to evaluation: The emergent differential diagnosis includes, but is not limited to, ectopic pregnancy, ovarian cyst, cystic rupture,. This is not an exhaustive differential.   Past Medical History / Co-morbidities / Social History: Asthma, depression, previous ruptured right tubal ectopic pregnancy  Additional history: Chart reviewed. Pertinent results include: Reviewed outpatient OB/GYN visits, lab work, imaging from hospital admission for ruptured ectopic pregnancy last year  Physical Exam: Physical exam performed. The pertinent findings include: Very mild tenderness to palpation in the right lower quadrant, otherwise patient is well-appearing, stable vital signs, afebrile.  I do not palpate a gravid uterus.  She denies any vaginal discharge and given her abdominal exam and lack of dyspareunia, vaginal discharge per patient we deferred a pelvic exam at this time, she is requesting STI screening and will self swab.  Lab Tests/Imaging studies: I personally interpreted labs/imaging and the pertinent results include: CBC unremarkable, CMP notable for mild hypokalemia, testing 3.3, wet  prep is unremarkable, lipase is normal, pregnancy test is negative.  I dependently interpreted pelvic ultrasound which shows no acute abnormality.. I agree with the radiologist interpretation.  Medications: I ordered medication including potassium for mild hypokalemia.  I have reviewed the patients home medicines and have made adjustments as needed.   Disposition: After consideration of the diagnostic results and the patients response to treatment, I feel that patient with no acute abnormality to explain her early menses, and right lower quadrant abdominal pain, encouraged follow-up with OB/GYN, reassured no evidence of ectopic pregnancy, ovarian cyst, or other abnormality.   emergency department workup does not suggest an emergent condition requiring  admission or immediate intervention beyond what has been performed at this time. The plan is: as above. The patient is safe for discharge and has been instructed to return immediately for worsening symptoms, change in symptoms or any other concerns.  Final Clinical Impression(s) / ED Diagnoses Final diagnoses:  None    Rx / DC Orders ED Discharge Orders     None         [place] 02/19/23 1848    Lonell Grandchild, MD 02/19/23 2238

## 2023-02-19 NOTE — Discharge Instructions (Addendum)
Please follow-up with your primary care doctor/obgyn, return to the emergency department if you are having severe worsening abdominal pain or vaginal bleeding.

## 2023-02-20 LAB — GC/CHLAMYDIA PROBE AMP (~~LOC~~) NOT AT ARMC
Chlamydia: NEGATIVE
Comment: NEGATIVE
Comment: NORMAL
Neisseria Gonorrhea: NEGATIVE

## 2023-03-05 ENCOUNTER — Other Ambulatory Visit (HOSPITAL_COMMUNITY)
Admission: RE | Admit: 2023-03-05 | Discharge: 2023-03-05 | Disposition: A | Discharge status: Home / Self Care | Payer: Medicaid Other | Source: Ambulatory Visit | Attending: Obstetrics | Admitting: Obstetrics

## 2023-03-05 ENCOUNTER — Ambulatory Visit (INDEPENDENT_AMBULATORY_CARE_PROVIDER_SITE_OTHER): Payer: Medicaid Other | Admitting: Obstetrics

## 2023-03-05 ENCOUNTER — Encounter: Payer: Self-pay | Admitting: Obstetrics

## 2023-03-05 VITALS — BP 100/60 | HR 69 | Ht 61.0 in | Wt 85.1 lb

## 2023-03-05 DIAGNOSIS — Z113 Encounter for screening for infections with a predominantly sexual mode of transmission: Secondary | ICD-10-CM | POA: Diagnosis not present

## 2023-03-05 DIAGNOSIS — F320 Major depressive disorder, single episode, mild: Secondary | ICD-10-CM

## 2023-03-05 DIAGNOSIS — Z0000 Encounter for general adult medical examination without abnormal findings: Secondary | ICD-10-CM

## 2023-03-05 DIAGNOSIS — Z01419 Encounter for gynecological examination (general) (routine) without abnormal findings: Secondary | ICD-10-CM | POA: Diagnosis not present

## 2023-03-05 DIAGNOSIS — N898 Other specified noninflammatory disorders of vagina: Secondary | ICD-10-CM | POA: Diagnosis not present

## 2023-03-05 DIAGNOSIS — Z1151 Encounter for screening for human papillomavirus (HPV): Secondary | ICD-10-CM

## 2023-03-05 DIAGNOSIS — Z3009 Encounter for other general counseling and advice on contraception: Secondary | ICD-10-CM

## 2023-03-05 NOTE — Progress Notes (Signed)
Subjective:        Brianna Thornton is a 22 y.o. female here for a routine exam.  Current complaints: Feeling down and depressed.  She feels that she wants to hurt herself at times. Also c/o vaginal discharge..    Personal health questionnaire:  Is patient Brianna Thornton, have a family history of breast and/or ovarian cancer: no Is there a family history of uterine cancer diagnosed at age < 16, gastrointestinal cancer, urinary tract cancer, family member who is a Personnel officer syndrome-associated carrier: no Is the patient overweight and hypertensive, family history of diabetes, personal history of gestational diabetes, preeclampsia or PCOS: no Is patient over 63, have PCOS,  family history of premature CHD under age 50, diabetes, smoke, have hypertension or peripheral artery disease:  no At any time, has a partner hit, kicked or otherwise hurt or frightened you?: no Over the past 2 weeks, have you felt down, depressed or hopeless?: no Over the past 2 weeks, have you felt little interest or pleasure in doing things?:no   Gynecologic History Patient's last menstrual period was 02/18/2023. Contraception: none Last Pap: none. Results were: none Last mammogram: n/a. Results were: n/a  Obstetric History OB History  Gravida Para Term Preterm AB Living  4 1 1   2 1   SAB IAB Ectopic Multiple Live Births  2     0 1    # Outcome Date GA Lbr Len/2nd Weight Sex Type Anes PTL Lv  4 Gravida           3 Term 07/06/21 [redacted]w[redacted]d 09:34 / 00:46 7 lb 14.3 oz (3.58 kg) M Vag-Spont EPI  LIV     Birth Comments: none  2 SAB           1 SAB             Past Medical History:  Diagnosis Date   Asthma    Depression    therapist said bipolar, pt does not want to take meds   Ovarian cyst     Past Surgical History:  Procedure Laterality Date   ECTOPIC PREGNANCY SURGERY     LAPAROSCOPY N/A 01/24/2022   Procedure: LAPAROSCOPY DIAGNOSTIC,RIGHT SALPINGECTOMY FOR RUPTURED ECTOPIC;  Surgeon: [name], MD;   Location: [facility] OR;  Service: Gynecology;  Laterality: N/A;   NO PAST SURGERIES      No current outpatient medications on file.  Current Facility-Administered Medications:    medroxyPROGESTERone (DEPO-PROVERA) injection 150 mg, 150 mg, Intramuscular, Q90 days, Constant, [name], MD, 150 mg at 05/02/22 1355 No Known Allergies  Social History   Tobacco Use   Smoking status: Never   Smokeless tobacco: Never  Substance Use Topics   Alcohol use: Yes    Comment: occasionally    Family History  Problem Relation Age of Onset   Bipolar disorder Mother    Healthy Father       Review of Systems  Constitutional: negative for fatigue and weight loss Respiratory: negative for cough and wheezing Cardiovascular: negative for chest pain, fatigue and palpitations Gastrointestinal: negative for abdominal pain and change in bowel habits Musculoskeletal:negative for myalgias Neurological: negative for gait problems and tremors Behavioral/Psych: positive for depression Endocrine: negative for temperature intolerance    Genitourinary: positive for vaginal discharge.  negative for abnormal menstrual periods, genital lesions, hot flashes, sexual problems  Integument/breast: negative for breast lump, breast tenderness, nipple discharge and skin lesion(s)    Objective:       BP 100/60 (BP Location: Left  Arm)   Pulse 69   Ht 5\' 1"  (1.549 m)   Wt 85 lb 1.6 oz (38.6 kg)   LMP 02/18/2023   BMI 16.08 kg/m  General:   Alert and no distress  Skin:   no rash or abnormalities  Lungs:   clear to auscultation bilaterally  Heart:   regular rate and rhythm, S1, S2 normal, no murmur, click, rub or gallop  Breasts:   normal without suspicious masses, skin or nipple changes or axillary nodes  Abdomen:  normal findings: no organomegaly, soft, non-tender and no hernia  Pelvis:  External genitalia: normal general appearance Urinary system: urethral meatus normal and bladder without fullness, nontender Vaginal:  normal without tenderness, induration or masses Cervix: normal appearance Adnexa: normal bimanual exam Uterus: anteverted and non-tender, normal size   Lab Review Urine pregnancy test Labs reviewed yes Radiologic studies reviewed no  I have spent a total of 30 minutes of face-to-face time, excluding clinical staff time, reviewing notes and preparing to see patient, ordering tests and/or medications, and counseling the patient.   Assessment:    1. Encounter for gynecological examination with Papanicolaou smear of cervix Rx: - Cytology - PAP( [name])  2. Vaginal discharge Rx: - Cervicovaginal ancillary only( [name])  3. Screen for STD (sexually transmitted disease) Rx: - HIV Antibody (routine testing w rflx) - Hepatitis B surface antigen - RPR - Hepatitis C antibody  4. Encounter for counseling regarding contraception - declines.  Plans to conceive soon - importance of folic acid stressed along with healthy diet and lifestyle - PNV's Rx  5. Routine health maintenance Rx: - TSH - Ferritin - CBC - Comprehensive metabolic panel - VITAMIN D 25 Hydroxy (Vit-D Deficiency, Fractures)  6. Current mild episode of major depressive disorder, unspecified whether recurrent (HCC) - patient counseled and agreed to further evaluation and management since she feel that she may harm herself at times    Plan:    Education reviewed: calcium supplements, depression evaluation, low fat, low cholesterol diet, safe sex/STD prevention, self breast exams, and weight bearing exercise. Contraception: none. Follow up in: 1 year. Patient sent to [facility] for evaluation of depression.     [name], MD, FACOG Attending Obstetrician & Gynecologist, [facility] for [facility], [facility] , [place], [place].  Femina 03/05/2023

## 2023-03-06 ENCOUNTER — Other Ambulatory Visit: Payer: Self-pay | Admitting: Family Medicine

## 2023-03-06 DIAGNOSIS — E559 Vitamin D deficiency, unspecified: Secondary | ICD-10-CM

## 2023-03-06 LAB — COMPREHENSIVE METABOLIC PANEL
ALT: 10 [IU]/L (ref 0–32)
AST: 13 [IU]/L (ref 0–40)
Albumin: 4.4 g/dL (ref 4.0–5.0)
Alkaline Phosphatase: 75 [IU]/L (ref 44–121)
BUN/Creatinine Ratio: 11 (ref 9–23)
BUN: 7 mg/dL (ref 6–20)
Bilirubin Total: 0.4 mg/dL (ref 0.0–1.2)
CO2: 22 mmol/L (ref 20–29)
Calcium: 9.1 mg/dL (ref 8.7–10.2)
Chloride: 106 mmol/L (ref 96–106)
Creatinine, Ser: 0.66 mg/dL (ref 0.57–1.00)
Globulin, Total: 2 g/dL (ref 1.5–4.5)
Glucose: 82 mg/dL (ref 70–99)
Potassium: 3.6 mmol/L (ref 3.5–5.2)
Sodium: 143 mmol/L (ref 134–144)
Total Protein: 6.4 g/dL (ref 6.0–8.5)
eGFR: 128 mL/min/{1.73_m2} (ref >59)

## 2023-03-06 LAB — VITAMIN D 25 HYDROXY (VIT D DEFICIENCY, FRACTURES): Vit D, 25-Hydroxy: <4 ng/mL — ABNORMAL LOW (ref 30.0–100.0)

## 2023-03-06 LAB — CBC
Hematocrit: 41.1 % (ref 34.0–46.6)
Hemoglobin: 13.3 g/dL (ref 11.1–15.9)
MCH: 29.4 pg (ref 26.6–33.0)
MCHC: 32.4 g/dL (ref 31.5–35.7)
MCV: 91 fL (ref 79–97)
Platelets: 342 10*3/uL (ref 150–450)
RBC: 4.53 x10E6/uL (ref 3.77–5.28)
RDW: 12.7 % (ref 11.7–15.4)
WBC: 7.3 10*3/uL (ref 3.4–10.8)

## 2023-03-06 LAB — RPR: RPR Ser Ql: NONREACTIVE

## 2023-03-06 LAB — FERRITIN: Ferritin: 62 ng/mL (ref 15–150)

## 2023-03-06 LAB — HEPATITIS C ANTIBODY: Hep C Virus Ab: NONREACTIVE

## 2023-03-06 LAB — TSH: TSH: 0.432 u[IU]/mL — ABNORMAL LOW (ref 0.450–4.500)

## 2023-03-06 LAB — HIV ANTIBODY (ROUTINE TESTING W REFLEX): HIV Screen 4th Generation wRfx: NONREACTIVE

## 2023-03-06 LAB — HEPATITIS B SURFACE ANTIGEN: Hepatitis B Surface Ag: NEGATIVE

## 2023-03-06 MED ORDER — VITAMIN D (ERGOCALCIFEROL) 1.25 MG (50000 UNIT) PO CAPS: 50000.0000 [IU] | ORAL_CAPSULE | ORAL | 0 refills | Status: DC

## 2023-03-07 LAB — CERVICOVAGINAL ANCILLARY ONLY
Bacterial Vaginitis (gardnerella): NEGATIVE
Candida Glabrata: NEGATIVE
Candida Vaginitis: NEGATIVE
Chlamydia: NEGATIVE
Comment: NEGATIVE
Comment: NEGATIVE
Comment: NEGATIVE
Comment: NEGATIVE
Comment: NEGATIVE
Comment: NORMAL
Neisseria Gonorrhea: NEGATIVE
Trichomonas: NEGATIVE

## 2023-03-07 LAB — CYTOLOGY - PAP: Diagnosis: NEGATIVE

## 2023-03-09 LAB — T3: T3, Total: 121 ng/dL (ref 71–180)

## 2023-03-09 LAB — SPECIMEN STATUS REPORT

## 2023-03-09 LAB — T4: T4, Total: 8.7 ug/dL (ref 4.5–12.0)

## 2023-05-03 ENCOUNTER — Ambulatory Visit
Admission: EM | Admit: 2023-05-03 | Discharge: 2023-05-03 | Disposition: A | Discharge status: Home / Self Care | Payer: Medicaid Other | Attending: Physician Assistant | Admitting: Physician Assistant

## 2023-05-03 DIAGNOSIS — Z113 Encounter for screening for infections with a predominantly sexual mode of transmission: Secondary | ICD-10-CM | POA: Insufficient documentation

## 2023-05-03 NOTE — ED Provider Notes (Signed)
 [facility]    CSN: [id number] Arrival date & time: 05/03/23  1416      History   Chief Complaint No chief complaint on file.   HPI KEYUNDRA FANT is a 23 y.o. female.   Patient here for STD testing.  No exposures, no sx.      Past Medical History:  Diagnosis Date   Asthma    Depression    therapist said bipolar, pt does not want to take meds   Ovarian cyst     Patient Active Problem List   Diagnosis Date Noted   Ruptured right tubal ectopic pregnancy causing hemoperitoneum    Shoulder dystocia during labor and delivery 07/08/2021   History of COVID-19 04/10/2021    Past Surgical History:  Procedure Laterality Date   ECTOPIC PREGNANCY SURGERY     LAPAROSCOPY N/A 01/24/2022   Procedure: LAPAROSCOPY DIAGNOSTIC,RIGHT SALPINGECTOMY FOR RUPTURED ECTOPIC;  Surgeon: Alger Gong, MD;  Location: [facility];  Service: Gynecology;  Laterality: N/A;   NO PAST SURGERIES      OB History     Gravida  4   Para  1   Term  1   Preterm      AB  2   Living  1      SAB  2   IAB      Ectopic      Multiple  0   Live Births  1            Home Medications    Prior to Admission medications   Medication Sig Start Date End Date Taking? Authorizing Provider  Vitamin D , Ergocalciferol , (DRISDOL ) 1.25 MG (50000 UNIT) CAPS capsule Take 1 capsule (50,000 Units total) by mouth every 7 (seven) days. 03/06/23   Fredirick Glenys RAMAN, MD    Family History Family History  Problem Relation Age of Onset   Bipolar disorder Mother    Healthy Father     Social History Social History   Tobacco Use   Smoking status: Never   Smokeless tobacco: Never  Vaping Use   Vaping status: Never Used  Substance Use Topics   Alcohol use: Yes    Comment: occasionally   Drug use: Yes    Types: Marijuana     Allergies   Patient has no known allergies.   Review of Systems Review of Systems  Constitutional:  Negative for chills, fatigue and fever.  Gastrointestinal:   Negative for abdominal pain, nausea and vomiting.  Genitourinary:  Negative for decreased urine volume, difficulty urinating, dysuria, flank pain, frequency, genital sores, hematuria, urgency, vaginal bleeding, vaginal discharge and vaginal pain.  Musculoskeletal:  Negative for back pain.  Skin:  Negative for rash and wound.  Allergic/Immunologic: Negative for environmental allergies and immunocompromised state.  Neurological:  Negative for headaches.  Hematological:  Negative for adenopathy. Does not bruise/bleed easily.  Psychiatric/Behavioral:  Negative for sleep disturbance.      Physical Exam Triage Vital Signs ED Triage Vitals  Encounter Vitals Group     BP 05/03/23 1428 104/69     Systolic BP Percentile --      Diastolic BP Percentile --      Pulse Rate 05/03/23 1428 63     Resp 05/03/23 1428 16     Temp 05/03/23 1428 98.9 F (37.2 C)     Temp Source 05/03/23 1428 Oral     SpO2 05/03/23 1428 97 %     Weight --      Height --  Head Circumference --      Peak Flow --      Pain Score 05/03/23 1426 0     Pain Loc --      Pain Education --      Exclude from Growth Chart --    No data found.  Updated Vital Signs BP 104/69 (BP Location: Right Arm)   Pulse 63   Temp 98.9 F (37.2 C) (Oral)   Resp 16   LMP 04/21/2023 (Approximate)   SpO2 97%   Breastfeeding No   Visual Acuity Right Eye Distance:   Left Eye Distance:   Bilateral Distance:    Right Eye Near:   Left Eye Near:    Bilateral Near:     Physical Exam Vitals and nursing note reviewed.  Constitutional:      General: She is not in acute distress.    Appearance: Normal appearance. She is well-developed. She is not ill-appearing.  HENT:     Head: Normocephalic and atraumatic.     Nose: Nose normal.  Eyes:     General: No scleral icterus.    Extraocular Movements: Extraocular movements intact.     Conjunctiva/sclera: Conjunctivae normal.  Pulmonary:     Effort: Pulmonary effort is normal. No  respiratory distress.  Musculoskeletal:     Cervical back: Normal range of motion and neck supple. No rigidity.  Skin:    General: Skin is warm and dry.  Neurological:     General: No focal deficit present.     Mental Status: She is alert and oriented to person, place, and time.     Motor: No weakness.     Gait: Gait normal.  Psychiatric:        Mood and Affect: Mood normal.        Behavior: Behavior normal.      UC Treatments / Results  Labs (all labs ordered are listed, but only abnormal results are displayed) Labs Reviewed - No data to display  EKG   Radiology No results found.  Procedures Procedures (including critical care time)  Medications Ordered in UC Medications - No data to display  Initial Impression / Assessment and Plan / UC Course  I have reviewed the triage vital signs and the nursing notes.  Pertinent labs & imaging results that were available during my care of the patient were reviewed by me and considered in my medical decision making (see chart for details).     Lab results available via mychart in 2 - 3 days Final Clinical Impressions(s) / UC Diagnoses   Final diagnoses:  Screen for STD (sexually transmitted disease)     Discharge Instructions      Labs available via mychart in 2 - 3 days   ED Prescriptions   None    PDMP not reviewed this encounter.   Juleen Rush, PA-C 05/03/23 1445

## 2023-05-03 NOTE — ED Triage Notes (Signed)
 Pt presents to UC for std testing. Denies symptoms.

## 2023-05-03 NOTE — Discharge Instructions (Addendum)
 Labs available via mychart in 2 - 3 days

## 2023-05-06 LAB — CERVICOVAGINAL ANCILLARY ONLY
Chlamydia: NEGATIVE
Comment: NEGATIVE
Comment: NEGATIVE
Comment: NORMAL
Neisseria Gonorrhea: NEGATIVE
Trichomonas: NEGATIVE

## 2023-05-30 ENCOUNTER — Ambulatory Visit: Admitting: Obstetrics and Gynecology

## 2023-06-04 ENCOUNTER — Ambulatory Visit: Admitting: Obstetrics & Gynecology

## 2023-06-04 VITALS — BP 131/66 | HR 97 | Wt 93.4 lb

## 2023-06-04 DIAGNOSIS — E559 Vitamin D deficiency, unspecified: Secondary | ICD-10-CM | POA: Diagnosis not present

## 2023-06-04 DIAGNOSIS — Z30011 Encounter for initial prescription of contraceptive pills: Secondary | ICD-10-CM

## 2023-06-04 MED ORDER — NORGESTREL-ETHINYL ESTRADIOL 0.3-30 MG-MCG PO TABS: 1.0000 | ORAL_TABLET | Freq: Every day | ORAL | 5 refills | Status: DC

## 2023-06-04 MED ORDER — VITAMIN D (ERGOCALCIFEROL) 1.25 MG (50000 UNIT) PO CAPS: 50000.0000 [IU] | ORAL_CAPSULE | ORAL | 4 refills | Status: AC

## 2023-06-04 NOTE — Progress Notes (Signed)
 Pt presents for bc change. Pt wants to start taking the pill.

## 2023-06-04 NOTE — Progress Notes (Signed)
    GYNECOLOGY PROGRESS NOTE  Subjective:    Patient ID: Brianna Thornton, female    DOB: 2001/02/04, 23 y.o.   MRN: [id number]  HPI  Patient is a 23 y.o. single [id number](1 yo son) here today to discuss contraception. She used depo provera 01/2022 after a ruptured ectopic pregnancy. She used depo for 2 shots, stopped due to irregular bleeding.  She was basically abstinent for all of 2024 so didn't use any contraception. Her periods are now monthly, lasting about 5 days.  She restarted with a sexual relationship the beginning of 2025. She had negative STI testing 04/2023. She is using withdrawal for contraception. She is interested in taking OCPs. She has not used them in the past. She already takes an OTC muscle building supplement daily and thinks that she will have no problems remembering to take an OCP daily.    The following portions of the patient's history were reviewed and updated as appropriate: allergies, current medications, past family history, past medical history, past social history, past surgical history, and problem list.  Review of Systems Pertinent items are noted in HPI.  She had a normal pap smear 02/2023. She works at [employer], Equities trader.  Objective:   Blood pressure 131/66, pulse 97, weight 93 lb 6.4 oz (42.4 kg), last menstrual period 05/22/2023, not currently breastfeeding. Body mass index is 17.65 kg/m. General appearance: alert Exam deferred   Assessment:   1. Vitamin D deficiency     2.      Contraception-  start OCPs  (lo ovral) with first day of NMP. Rec back up method for first week. She will send a message if for some reason she doesn't like this brand  Plan:   1. Vitamin D deficiency (Primary)  - Vitamin D 1,25 dihydroxy - Vitamin D, Ergocalciferol, (DRISDOL) 1.25 MG (50000 UNIT) CAPS capsule; Take 1 capsule (50,000 Units total) by mouth every 7 (seven) days.  Dispense: 12 capsule; Refill: 4

## 2023-06-15 LAB — VITAMIN D 1,25 DIHYDROXY
Vitamin D 1, 25 (OH)2 Total: 47 pg/mL
Vitamin D2 1, 25 (OH)2: 36 pg/mL
Vitamin D3 1, 25 (OH)2: 11 pg/mL

## 2023-06-17 ENCOUNTER — Ambulatory Visit: Admitting: *Deleted

## 2023-06-17 ENCOUNTER — Other Ambulatory Visit (HOSPITAL_COMMUNITY)
Admission: RE | Admit: 2023-06-17 | Discharge: 2023-06-17 | Disposition: A | Discharge status: Home / Self Care | Source: Ambulatory Visit | Attending: Obstetrics and Gynecology | Admitting: Obstetrics and Gynecology

## 2023-06-17 ENCOUNTER — Ambulatory Visit (HOSPITAL_COMMUNITY)
Admission: EM | Admit: 2023-06-17 | Discharge: 2023-06-17 | Disposition: A | Discharge status: Home / Self Care | Attending: Family Medicine | Admitting: Family Medicine

## 2023-06-17 VITALS — BP 95/61 | HR 80

## 2023-06-17 DIAGNOSIS — Z113 Encounter for screening for infections with a predominantly sexual mode of transmission: Secondary | ICD-10-CM

## 2023-06-17 NOTE — Progress Notes (Signed)
 SUBJECTIVE:  23 y.o. female who desires a STI screen. Denies abnormal vaginal discharge, bleeding or significant pelvic pain. No UTI symptoms. Denies history of known exposure to STD.  Patient's last menstrual period was 05/22/2023 (exact date).  OBJECTIVE:  She appears well.   ASSESSMENT:  STI Screen   PLAN:  Pt offered STI blood screening-not indicated (Recently Tested 03/05/23) GC, chlamydia, and trichomonas probe sent to lab.  Treatment: To be determined once lab results are received.  Pt follow up as needed.

## 2023-06-17 NOTE — ED Notes (Signed)
 Called patient from waiting area. No answer or voice mail.

## 2023-06-17 NOTE — ED Notes (Signed)
 Called patient x 2

## 2023-06-18 ENCOUNTER — Encounter: Payer: Self-pay | Admitting: Obstetrics and Gynecology

## 2023-06-18 LAB — CERVICOVAGINAL ANCILLARY ONLY
Bacterial Vaginitis (gardnerella): POSITIVE — AB
Candida Glabrata: NEGATIVE
Candida Vaginitis: POSITIVE — AB
Chlamydia: NEGATIVE
Comment: NEGATIVE
Comment: NEGATIVE
Comment: NEGATIVE
Comment: NEGATIVE
Comment: NEGATIVE
Comment: NORMAL
Neisseria Gonorrhea: NEGATIVE
Trichomonas: NEGATIVE

## 2023-06-18 MED ORDER — NUVESSA 1.3 % VA GEL: 1.0000 | Freq: Every evening | VAGINAL | 0 refills | Status: AC

## 2023-06-18 MED ORDER — FLUCONAZOLE 150 MG PO TABS: 150.0000 mg | ORAL_TABLET | Freq: Once | ORAL | 0 refills | Status: AC

## 2023-06-18 NOTE — Addendum Note (Signed)
 Addended by: [name] on: 06/18/2023 03:45 PM   Modules accepted: Orders

## 2023-07-16 ENCOUNTER — Ambulatory Visit: Admission: EM | Admit: 2023-07-16 | Discharge: 2023-07-16 | Discharge status: Against Medical Advice

## 2023-07-16 ENCOUNTER — Ambulatory Visit
Admission: EM | Admit: 2023-07-16 | Discharge: 2023-07-16 | Disposition: A | Discharge status: Home / Self Care | Attending: Family Medicine | Admitting: Family Medicine

## 2023-07-16 DIAGNOSIS — N898 Other specified noninflammatory disorders of vagina: Secondary | ICD-10-CM | POA: Insufficient documentation

## 2023-07-16 DIAGNOSIS — Z113 Encounter for screening for infections with a predominantly sexual mode of transmission: Secondary | ICD-10-CM | POA: Diagnosis present

## 2023-07-16 LAB — POCT URINALYSIS DIP (MANUAL ENTRY)
Bilirubin, UA: NEGATIVE
Glucose, UA: NEGATIVE mg/dL
Ketones, POC UA: NEGATIVE mg/dL
Nitrite, UA: NEGATIVE
Spec Grav, UA: 1.025 (ref 1.010–1.025)
Urobilinogen, UA: 0.2 U/dL
pH, UA: 7 (ref 5.0–8.0)

## 2023-07-16 LAB — POCT URINE PREGNANCY: Preg Test, Ur: NEGATIVE

## 2023-07-16 MED ORDER — NAPROXEN 375 MG PO TABS: 375.0000 mg | ORAL_TABLET | Freq: Two times a day (BID) | ORAL | 0 refills | Status: DC

## 2023-07-16 NOTE — Discharge Instructions (Signed)
 You can use naproxen  for pelvic pains, cramps. Make sure you hydrate very well with plain water and a quantity of 64 ounces of water a day.  Please limit drinks that are considered urinary irritants such as soda, sweet tea, coffee, energy drinks, alcohol.  These can worsen urinary and genital symptoms but also be the source of them.  I will let you know about your sexually transmitted infection test results through MyChart or by phone to see if we need to prescribe or change your antibiotics based off of those results.

## 2023-07-16 NOTE — ED Notes (Signed)
 Called pt on the phone and said she left because she had to pick someone up.

## 2023-07-16 NOTE — ED Provider Notes (Signed)
 [facility]  Note:  This document was prepared using Conservation officer, historic buildings and may include unintentional dictation errors.  MRN: [id number] DOB: [birth year]  Subjective:   Brianna Thornton is a 23 y.o. female presenting for several day history of vaginal discharge, abdominal cramping, irregular vaginal bleeding.  No fever, nausea, vomiting, genital rashes.  Would like complete STI check including blood work and vaginal cytology.  She did start a new birth control over the past month.   Current Facility-Administered Medications:    medroxyPROGESTERone  (DEPO-PROVERA ) injection 150 mg, 150 mg, Intramuscular, Q90 days, Constant, [name], MD, 150 mg at 05/02/22 1355  Current Outpatient Medications:    norgestrel -ethinyl estradiol  (LO/OVRAL ) 0.3-30 MG-MCG tablet, Take 1 tablet by mouth daily., Disp: 84 tablet, Rfl: 5   Vitamin D , Ergocalciferol , (DRISDOL ) 1.25 MG (50000 UNIT) CAPS capsule, Take 1 capsule (50,000 Units total) by mouth every 7 (seven) days., Disp: 12 capsule, Rfl: 4   No Known Allergies  Past Medical History:  Diagnosis Date   Asthma    Depression    therapist said bipolar, pt does not want to take meds   Ovarian cyst      Past Surgical History:  Procedure Laterality Date   ECTOPIC PREGNANCY SURGERY     LAPAROSCOPY N/A 01/24/2022   Procedure: LAPAROSCOPY DIAGNOSTIC,RIGHT SALPINGECTOMY FOR RUPTURED ECTOPIC;  Surgeon: Verlyn Goad, MD;  Location: [facility] OR;  Service: Gynecology;  Laterality: N/A;   NO PAST SURGERIES      Family History  Problem Relation Age of Onset   Bipolar disorder Mother    Healthy Father     Social History   Tobacco Use   Smoking status: Never   Smokeless tobacco: Never  Vaping Use   Vaping status: Never Used  Substance Use Topics   Alcohol use: Yes    Comment: occasionally   Drug use: Yes    Types: Marijuana    ROS   Objective:   Vitals: BP 112/69 (BP Location: Left Arm)   Pulse 93   Temp 98.2  F (36.8 C) (Oral)   Resp 20   LMP 06/30/2023 (Approximate)   SpO2 96%   Physical Exam Constitutional:      General: She is not in acute distress.    Appearance: Normal appearance. She is well-developed. She is not ill-appearing, toxic-appearing or diaphoretic.  HENT:     Head: Normocephalic and atraumatic.     Nose: Nose normal.     Mouth/Throat:     Mouth: Mucous membranes are moist.  Eyes:     General: No scleral icterus.       Right eye: No discharge.        Left eye: No discharge.     Extraocular Movements: Extraocular movements intact.     Conjunctiva/sclera: Conjunctivae normal.  Cardiovascular:     Rate and Rhythm: Normal rate.  Pulmonary:     Effort: Pulmonary effort is normal.  Abdominal:     General: Bowel sounds are normal. There is no distension.     Palpations: Abdomen is soft. There is no mass.     Tenderness: There is no abdominal tenderness. There is no right CVA tenderness, left CVA tenderness, guarding or rebound.  Skin:    General: Skin is warm and dry.  Neurological:     General: No focal deficit present.     Mental Status: She is alert and oriented to person, place, and time.  Psychiatric:  Mood and Affect: Mood normal.        Behavior: Behavior normal.        Thought Content: Thought content normal.        Judgment: Judgment normal.     Results for orders placed or performed during the hospital encounter of 07/16/23 (from the past 24 hours)  POCT urinalysis dipstick     Status: Abnormal   Collection Time: 07/16/23  4:06 PM  Result Value Ref Range   Color, UA yellow yellow   Clarity, UA clear clear   Glucose, UA negative negative mg/dL   Bilirubin, UA negative negative   Ketones, POC UA negative negative mg/dL   Spec Grav, UA 4.034 7.425 - 1.025   Blood, UA trace-intact (A) negative   pH, UA 7.0 5.0 - 8.0   Protein Ur, POC trace (A) negative mg/dL   Urobilinogen, UA 0.2 0.2 or 1.0 E.U./dL   Nitrite, UA Negative Negative    Leukocytes, UA Trace (A) Negative  POCT urine pregnancy     Status: None   Collection Time: 07/16/23  4:06 PM  Result Value Ref Range   Preg Test, Ur Negative Negative    Assessment and Plan :   PDMP not reviewed this encounter.  1. Screen for STD (sexually transmitted disease)   2. Vaginal discharge    Recommended naproxen  for any pelvic cramps.  Will base treatment off of results.  Patient was in agreement.  Labs pending.  Counseled patient on potential for adverse effects with medications prescribed/recommended today, ER and return-to-clinic precautions discussed, patient verbalized understanding.    Adolph Hoop, PA-C 07/16/23 1620

## 2023-07-16 NOTE — ED Triage Notes (Signed)
 Pt c/o vaginal discharge, abdominal pain and irregular bleeding.Started new birth control last month.

## 2023-07-17 ENCOUNTER — Telehealth (HOSPITAL_COMMUNITY): Payer: Self-pay

## 2023-07-17 LAB — CERVICOVAGINAL ANCILLARY ONLY
Bacterial Vaginitis (gardnerella): POSITIVE — AB
Candida Glabrata: NEGATIVE
Candida Vaginitis: POSITIVE — AB
Chlamydia: NEGATIVE
Comment: NEGATIVE
Comment: NEGATIVE
Comment: NEGATIVE
Comment: NEGATIVE
Comment: NEGATIVE
Comment: NORMAL
Neisseria Gonorrhea: NEGATIVE
Trichomonas: NEGATIVE

## 2023-07-17 LAB — RPR: RPR Ser Ql: NONREACTIVE

## 2023-07-17 LAB — HIV ANTIBODY (ROUTINE TESTING W REFLEX): HIV Screen 4th Generation wRfx: NONREACTIVE

## 2023-07-17 MED ORDER — METRONIDAZOLE 500 MG PO TABS: 500.0000 mg | ORAL_TABLET | Freq: Two times a day (BID) | ORAL | 0 refills | Status: DC

## 2023-07-17 MED ORDER — FLUCONAZOLE 150 MG PO TABS: 150.0000 mg | ORAL_TABLET | Freq: Once | ORAL | 0 refills | Status: AC

## 2023-07-17 NOTE — Telephone Encounter (Signed)
 Per protocol, pt requires tx with metronidazole and Diflucan.  Rx sent to pharmacy on file.

## 2023-07-18 ENCOUNTER — Telehealth: Payer: Self-pay

## 2023-07-18 MED ORDER — METRONIDAZOLE 0.75 % VA GEL: 1.0000 | Freq: Two times a day (BID) | VAGINAL | 0 refills | Status: AC

## 2023-07-18 MED ORDER — METRONIDAZOLE 0.75 % VA GEL
1.0000 | Freq: Two times a day (BID) | VAGINAL | 0 refills | Status: DC
Start: 2023-07-18 — End: 2023-07-18

## 2023-07-18 NOTE — Telephone Encounter (Signed)
 Rx sent in for the metrogel .

## 2023-07-18 NOTE — Telephone Encounter (Signed)
 Patient called requesting to have Flagyl  tablets change to the gel. Stated she is not good with taking pills.

## 2023-09-21 ENCOUNTER — Telehealth: Admitting: Nurse Practitioner

## 2023-09-21 DIAGNOSIS — J029 Acute pharyngitis, unspecified: Secondary | ICD-10-CM | POA: Diagnosis not present

## 2023-09-21 MED ORDER — IBUPROFEN 100 MG/5ML PO SUSP: 600.0000 mg | Freq: Three times a day (TID) | ORAL | 1 refills | Status: DC | PRN

## 2023-09-21 NOTE — Progress Notes (Signed)
 Virtual Visit Consent   Brianna Thornton, you are scheduled for a virtual visit with a [facility] provider today. Just as with appointments in the office, your consent must be obtained to participate. Your consent will be active for this visit and any virtual visit you may have with one of our providers in the next 365 days. If you have a MyChart account, a copy of this consent can be sent to you electronically.  As this is a virtual visit, video technology does not allow for your provider to perform a traditional examination. This may limit your provider's ability to fully assess your condition. If your provider identifies any concerns that need to be evaluated in person or the need to arrange testing (such as labs, EKG, etc.), we will make arrangements to do so. Although advances in technology are sophisticated, we cannot ensure that it will always work on either your end or our end. If the connection with a video visit is poor, the visit may have to be switched to a telephone visit. With either a video or telephone visit, we are not always able to ensure that we have a secure connection.  By engaging in this virtual visit, you consent to the provision of healthcare and authorize for your insurance to be billed (if applicable) for the services provided during this visit. Depending on your insurance coverage, you may receive a charge related to this service.  I need to obtain your verbal consent now. Are you willing to proceed with your visit today? Brianna Thornton has provided verbal consent on 09/21/2023 for a virtual visit (video or telephone). Brianna LELON Servant, NP  Date: 09/21/2023 1:26 PM   Virtual Visit via Video Note   I, Brianna Thornton, connected with  Brianna Thornton  ([id number], 09-02-2000) on 09/21/23 at  1:15 PM EDT by a video-enabled telemedicine application and verified that I am speaking with the correct person using two identifiers.  Location: Patient: Virtual Visit Location Patient:  Home Provider: Virtual Visit Location Provider: Home Office   I discussed the limitations of evaluation and management by telemedicine and the availability of in person appointments. The patient expressed understanding and agreed to proceed.    History of Present Illness: Brianna Thornton is a 23 y.o. who identifies as a female who was assigned female at birth, and is being seen today for viral pharyngitis.  Over the past 24 hours Brianna Thornton has been experiencing symptoms of swollen tonsils, pain with swallowing, white patches in back of throat. She denies fever, cough, or any other URI symptoms.  She has not taken any medication for her symptoms    Problems:  Patient Active Problem List   Diagnosis Date Noted   Vitamin D  deficiency 06/04/2023   Ruptured right tubal ectopic pregnancy causing hemoperitoneum    Shoulder dystocia during labor and delivery 07/08/2021   History of COVID-19 04/10/2021    Allergies: No Known Allergies Medications:  Current Outpatient Medications:    ibuprofen  (ADVIL ) 100 MG/5ML suspension, Take 30 mLs (600 mg total) by mouth every 8 (eight) hours as needed for moderate pain (pain score 4-6)., Disp: 473 mL, Rfl: 1   naproxen  (NAPROSYN ) 375 MG tablet, Take 1 tablet (375 mg total) by mouth 2 (two) times daily with a meal., Disp: 30 tablet, Rfl: 0   norgestrel -ethinyl estradiol  (LO/OVRAL ) 0.3-30 MG-MCG tablet, Take 1 tablet by mouth daily., Disp: 84 tablet, Rfl: 5   Vitamin D , Ergocalciferol , (DRISDOL ) 1.25 MG (50000 UNIT)  CAPS capsule, Take 1 capsule (50,000 Units total) by mouth every 7 (seven) days., Disp: 12 capsule, Rfl: 4  Current Facility-Administered Medications:    medroxyPROGESTERone  (DEPO-PROVERA ) injection 150 mg, 150 mg, Intramuscular, Q90 days, Constant, [name], MD, 150 mg at 05/02/22 1355  Observations/Objective: Patient is well-developed, well-nourished in no acute distress.  Resting comfortably  at home.  Head is normocephalic, atraumatic.  No  labored breathing.  Speech is clear and coherent with logical content.  Patient is alert and oriented at baseline.    Assessment and Plan: 1. Acute viral pharyngitis (Primary) - ibuprofen  (ADVIL ) 100 MG/5ML suspension; Take 30 mLs (600 mg total) by mouth every 8 (eight) hours as needed for moderate pain (pain score 4-6).  Dispense: 473 mL; Refill: 1  Recommend warm salt water gargles as needed. Recommend warm tea with honey to help soothe the throat  Follow Up Instructions: I discussed the assessment and treatment plan with the patient. The patient was provided an opportunity to ask questions and all were answered. The patient agreed with the plan and demonstrated an understanding of the instructions.  A copy of instructions were sent to the patient via MyChart unless otherwise noted below.    The patient was advised to call back or seek an in-person evaluation if the symptoms worsen or if the condition fails to improve as anticipated.    Brianna Bearden W Ipek Westra, NP

## 2023-09-21 NOTE — Patient Instructions (Signed)
  Brianna Thornton, thank you for joining Brianna LELON Servant, NP for today's virtual visit.  While this provider is not your primary care provider (PCP), if your PCP is located in our provider database this encounter information will be shared with them immediately following your visit.   A Brianna Thornton MyChart account gives you access to today's visit and all your visits, tests, and labs performed at [facility]  click here if you don't have a Brianna Thornton MyChart account or go to [email or web address]  Consent: (Patient) Brianna Thornton provided verbal consent for this virtual visit at the beginning of the encounter.  Current Medications:  Current Outpatient Medications:    ibuprofen  (ADVIL ) 100 MG/5ML suspension, Take 30 mLs (600 mg total) by mouth every 8 (eight) hours as needed for moderate pain (pain score 4-6)., Disp: 473 mL, Rfl: 1   naproxen  (NAPROSYN ) 375 MG tablet, Take 1 tablet (375 mg total) by mouth 2 (two) times daily with a meal., Disp: 30 tablet, Rfl: 0   norgestrel -ethinyl estradiol  (LO/OVRAL ) 0.3-30 MG-MCG tablet, Take 1 tablet by mouth daily., Disp: 84 tablet, Rfl: 5   Vitamin D , Ergocalciferol , (DRISDOL ) 1.25 MG (50000 UNIT) CAPS capsule, Take 1 capsule (50,000 Units total) by mouth every 7 (seven) days., Disp: 12 capsule, Rfl: 4  Current Facility-Administered Medications:    medroxyPROGESTERone  (DEPO-PROVERA ) injection 150 mg, 150 mg, Intramuscular, Q90 days, Constant, [name], MD, 150 mg at 05/02/22 1355   Medications ordered in this encounter:  Meds ordered this encounter  Medications   ibuprofen  (ADVIL ) 100 MG/5ML suspension    Sig: Take 30 mLs (600 mg total) by mouth every 8 (eight) hours as needed for moderate pain (pain score 4-6).    Dispense:  473 mL    Refill:  1    Supervising Provider:   BLAISE ALEENE KIDD [[id number]]     *If you need refills on other medications prior to your next appointment, please contact your pharmacy*  Follow-Up: Call back or  seek an in-person evaluation if the symptoms worsen or if the condition fails to improve as anticipated.  [facility] [phone]  Other Instructions Recommend warm salt water gargles as needed. Recommend warm tea with honey to help soothe the throat   If you have been instructed to have an in-person evaluation today at a local Urgent Care facility, please use the link below. It will take you to a list of all of our available [place] [facility], including address, phone number and hours of operation. Please do not delay care.  [facility] [place] [facility]  If you or a family member do not have a primary care provider, use the link below to schedule a visit and establish care. When you choose a [place] primary care physician or advanced practice provider, you gain a long-term partner in health. Find a Primary Care Provider  Learn more about [facility] in-office and virtual care options: [facility] Get Care Now

## 2023-10-21 ENCOUNTER — Ambulatory Visit: Admitting: Obstetrics & Gynecology

## 2023-10-21 ENCOUNTER — Encounter: Payer: Self-pay | Admitting: Obstetrics & Gynecology

## 2023-10-21 VITALS — BP 121/84 | HR 79 | Ht 61.0 in | Wt 94.0 lb

## 2023-10-21 DIAGNOSIS — N921 Excessive and frequent menstruation with irregular cycle: Secondary | ICD-10-CM | POA: Diagnosis not present

## 2023-10-21 NOTE — Progress Notes (Signed)
 Started depression med Lexapro. States had two episodes of bleeding this month with Ocs. Denies cramping but 'tissue-like brown substance noted. Same steady partner. Denies risk of infection.

## 2023-10-21 NOTE — Progress Notes (Signed)
 Patient ID: Brianna Thornton, female   DOB: 30-Apr-2000, 23 y.o.   MRN: [id number]  Chief Complaint  Patient presents with   Vaginal Bleeding  BTB on OCP last cycle  HPI Brianna Thornton is a 23 y.o. female.  [id number] Patient's last menstrual period was 09/27/2023 (exact date). She started OCP 05/2023 and did well until her last cycle when she had 5 days of BTB spotting which has resolved. She was compliant with her OCP during this time and is ok to continue. Brianna Thornton HPI  Past Medical History:  Diagnosis Date   Asthma    Depression    therapist said bipolar, pt does not want to take meds   Ovarian cyst     Past Surgical History:  Procedure Laterality Date   ECTOPIC PREGNANCY SURGERY     LAPAROSCOPY N/A 01/24/2022   Procedure: LAPAROSCOPY DIAGNOSTIC,RIGHT SALPINGECTOMY FOR RUPTURED ECTOPIC;  Surgeon: Alger Gong, MD;  Location: [facility] OR;  Service: Gynecology;  Laterality: N/A;   NO PAST SURGERIES      Family History  Problem Relation Age of Onset   Bipolar disorder Mother    Healthy Father     Social History Social History   Tobacco Use   Smoking status: Never   Smokeless tobacco: Never  Vaping Use   Vaping status: Never Used  Substance Use Topics   Alcohol use: Yes    Comment: occasionally   Drug use: Yes    Types: Marijuana    No Known Allergies  Current Outpatient Medications  Medication Sig Dispense Refill   albuterol (VENTOLIN HFA) 108 (90 Base) MCG/ACT inhaler Inhale 2 puffs into the lungs every 4 (four) hours as needed for wheezing or shortness of breath (allergies).     escitalopram (LEXAPRO) 10 MG tablet Take 10 mg by mouth daily.     norgestrel -ethinyl estradiol  (LO/OVRAL ) 0.3-30 MG-MCG tablet Take 1 tablet by mouth daily. 84 tablet 5   Vitamin D , Ergocalciferol , (DRISDOL ) 1.25 MG (50000 UNIT) CAPS capsule Take 1 capsule (50,000 Units total) by mouth every 7 (seven) days. (Patient not taking: Reported on 10/21/2023) 12 capsule 4   Current Facility-Administered  Medications  Medication Dose Route Frequency Provider Last Rate Last Admin   medroxyPROGESTERone  (DEPO-PROVERA ) injection 150 mg  150 mg Intramuscular Q90 days Constant, [name], MD   150 mg at 05/02/22 1355    Review of Systems Review of Systems  Constitutional: Negative.   Respiratory: Negative.    Cardiovascular: Negative.   Gastrointestinal: Negative.   Genitourinary:  Positive for menstrual problem. Negative for pelvic pain, vaginal bleeding, vaginal discharge and vaginal pain.    Blood pressure 121/84, pulse 79, height 5' 1 (1.549 m), weight 94 lb (42.6 kg), last menstrual period 09/27/2023.  Physical Exam Physical Exam Vitals and nursing note reviewed. Exam conducted with a chaperone present.  Constitutional:      Appearance: Normal appearance.  HENT:     Head: Normocephalic and atraumatic.  Cardiovascular:     Rate and Rhythm: Normal rate.  Pulmonary:     Effort: Pulmonary effort is normal.  Neurological:     Mental Status: She is alert.  Psychiatric:        Mood and Affect: Mood normal.        Behavior: Behavior normal.     Data Reviewed Pap 02/2023 normal  Assessment Breakthrough bleeding on birth control pills   Plan She may continue with current prescription and we discussed not stopping her pills unless instructed to do so or  she desires pregnancy    Brianna Thornton 10/21/2023, 10:48 AM

## 2024-01-16 ENCOUNTER — Ambulatory Visit (INDEPENDENT_AMBULATORY_CARE_PROVIDER_SITE_OTHER)

## 2024-01-16 VITALS — BP 107/61 | HR 70 | Wt 98.0 lb

## 2024-01-16 DIAGNOSIS — Z8759 Personal history of other complications of pregnancy, childbirth and the puerperium: Secondary | ICD-10-CM

## 2024-01-16 DIAGNOSIS — Z3200 Encounter for pregnancy test, result unknown: Secondary | ICD-10-CM

## 2024-01-16 DIAGNOSIS — Z3201 Encounter for pregnancy test, result positive: Secondary | ICD-10-CM | POA: Diagnosis not present

## 2024-01-16 LAB — POCT URINE PREGNANCY: Preg Test, Ur: POSITIVE — AB

## 2024-01-16 MED ORDER — CVS PRENATAL GUMMY 0.4 MG PO CHEW: 3.0000 | CHEWABLE_TABLET | Freq: Every day | ORAL | 11 refills | Status: DC

## 2024-01-16 NOTE — Progress Notes (Signed)
..  Ms. Saunders presents today for UPT. She has no unusual complaints. LMP: 12/10/23 Pt reports hx of ectopic and was previously advised to notify provider with current pregnancy.    OBJECTIVE: Appears well, in no apparent distress.  OB History     Gravida  5   Para  1   Term  1   Preterm      AB  3   Living  1      SAB  2   IAB      Ectopic  1   Multiple  0   Live Births  1          Home UPT Result:Positive In-Office UPT result: Positive I have reviewed the patient's medical, obstetrical, social, and family histories, and medications.   ASSESSMENT: Positive pregnancy test, Hx of Ectopic  PLAN Prenatal care to be completed at: [facility] med list provided  PNV sent to pharmacy Consulted with Dr Abigail in office and advised pt to get STAT HCG today and report in 48hrs at MAU due to hx of ectopic. Pt agreed. Advised of abnormal symptoms to report

## 2024-01-17 ENCOUNTER — Ambulatory Visit: Payer: Self-pay | Admitting: Obstetrics and Gynecology

## 2024-01-17 LAB — BETA HCG QUANT (REF LAB): hCG Quant: 968 m[IU]/mL

## 2024-01-18 ENCOUNTER — Inpatient Hospital Stay (HOSPITAL_COMMUNITY)
Admission: AD | Admit: 2024-01-18 | Discharge: 2024-01-18 | Disposition: A | Discharge status: Home / Self Care | Attending: Obstetrics and Gynecology | Admitting: Obstetrics and Gynecology

## 2024-01-18 ENCOUNTER — Other Ambulatory Visit: Payer: Self-pay

## 2024-01-18 DIAGNOSIS — O09291 Supervision of pregnancy with other poor reproductive or obstetric history, first trimester: Secondary | ICD-10-CM | POA: Insufficient documentation

## 2024-01-18 DIAGNOSIS — F1290 Cannabis use, unspecified, uncomplicated: Secondary | ICD-10-CM | POA: Insufficient documentation

## 2024-01-18 DIAGNOSIS — Z3A01 Less than 8 weeks gestation of pregnancy: Secondary | ICD-10-CM | POA: Diagnosis not present

## 2024-01-18 DIAGNOSIS — N912 Amenorrhea, unspecified: Secondary | ICD-10-CM

## 2024-01-18 DIAGNOSIS — O99321 Drug use complicating pregnancy, first trimester: Secondary | ICD-10-CM | POA: Insufficient documentation

## 2024-01-18 DIAGNOSIS — Z3481 Encounter for supervision of other normal pregnancy, first trimester: Secondary | ICD-10-CM

## 2024-01-18 LAB — HCG, QUANTITATIVE, PREGNANCY: hCG, Beta Chain, Quant, S: 2705 m[IU]/mL — ABNORMAL HIGH (ref <5)

## 2024-01-18 NOTE — MAU Provider Note (Signed)
 None     S [name] is a 23 y.o. [phone] pregnant female at [redacted]w[redacted]d who presents to MAU today with complaint of  follow up betaHcg as recommended by [facility] provider due to history of ectopic pregnancy. Denies pain, vaginal bleeding, discharge or other complaint today.    Receives care at [facility]. Prenatal records reviewed.  Pertinent items noted in HPI and remainder of comprehensive ROS otherwise negative.   O BP 119/73 (BP Location: Right Arm)   Temp 98 F (36.7 C) (Oral)   Resp 15   Ht 5' 1 (1.549 m)   Wt 43.4 kg   LMP 12/10/2023   SpO2 99%   BMI 18.06 kg/m  Physical Exam Vitals reviewed.  Constitutional:      Appearance: Normal appearance.  HENT:     Head: Normocephalic.  Cardiovascular:     Rate and Rhythm: Normal rate and regular rhythm.     Pulses: Normal pulses.     Heart sounds: Normal heart sounds.  Pulmonary:     Effort: Pulmonary effort is normal.     Breath sounds: Normal breath sounds.  Skin:    General: Skin is warm and dry.     Capillary Refill: Capillary refill takes less than 2 seconds.  Neurological:     General: No focal deficit present.     Mental Status: She is alert and oriented to person, place, and time.  Psychiatric:        Mood and Affect: Mood normal.        Behavior: Behavior normal.        Thought Content: Thought content normal.        Judgment: Judgment normal.      MDM: Moderate Trend beta hcg.   MAU Course:  A Amenorrhea - Plan: Discharge patient  Encounter for supervision of other normal pregnancy in first trimester  Marijuana use during pregnancy  Medical screening exam complete Beta Hcg 2705. No pain, bleeding or other concerns.   P Discharge from MAU in stable condition with routine pregnancy and ectopic precautions Follow up at [facility] as scheduled for ongoing prenatal care. Advised cessation of marijuana use in pregnancy and promotion of alternatives.   Allergies as of 01/18/2024   No Known Allergies       Medication List     STOP taking these medications    norgestrel -ethinyl estradiol  0.3-30 MG-MCG tablet Commonly known as: LO/OVRAL        TAKE these medications    albuterol 108 (90 Base) MCG/ACT inhaler Commonly known as: VENTOLIN HFA Inhale 2 puffs into the lungs every 4 (four) hours as needed for wheezing or shortness of breath (allergies).   [employer] Prenatal Gummy 0.4 MG Chew Chew 3 tablets by mouth daily.   escitalopram 10 MG tablet Commonly known as: LEXAPRO Take 10 mg by mouth daily.   Vitamin D  (Ergocalciferol ) 1.25 MG (50000 UNIT) Caps capsule Commonly known as: DRISDOL  Take 1 capsule (50,000 Units total) by mouth every 7 (seven) days.        Camie Rote, MSN, CNM 01/18/2024 12:58 PM  Certified Nurse Midwife, [facility]

## 2024-01-18 NOTE — MAU Note (Signed)
 Brianna Thornton is a 23 y.o. at [redacted]w[redacted]d here in MAU reporting: follow up quant  +HPT on 01/10/24 and has hx of ectopic pregnancy so needs to be followed closely with this pregnancy. Denies any abdominal pain or bleeding. 01/16/24- 968  LMP: 12/13/23 Pain score: 0 Vitals:   01/18/24 1112  BP: 119/73  Resp: 15  Temp: 98 F (36.7 C)  SpO2: 99%     Lab orders placed from triage:  bhcg

## 2024-01-21 ENCOUNTER — Other Ambulatory Visit: Payer: Self-pay | Admitting: Medical Genetics

## 2024-02-05 ENCOUNTER — Other Ambulatory Visit (INDEPENDENT_AMBULATORY_CARE_PROVIDER_SITE_OTHER): Payer: Self-pay

## 2024-02-05 ENCOUNTER — Other Ambulatory Visit (HOSPITAL_COMMUNITY)
Admission: RE | Admit: 2024-02-05 | Discharge: 2024-02-05 | Disposition: A | Discharge status: Home / Self Care | Source: Ambulatory Visit | Attending: Obstetrics & Gynecology | Admitting: Obstetrics & Gynecology

## 2024-02-05 ENCOUNTER — Ambulatory Visit (INDEPENDENT_AMBULATORY_CARE_PROVIDER_SITE_OTHER): Admitting: *Deleted

## 2024-02-05 VITALS — BP 98/61 | HR 103 | Wt 97.6 lb

## 2024-02-05 DIAGNOSIS — O3680X0 Pregnancy with inconclusive fetal viability, not applicable or unspecified: Secondary | ICD-10-CM

## 2024-02-05 DIAGNOSIS — Z3A08 8 weeks gestation of pregnancy: Secondary | ICD-10-CM | POA: Diagnosis not present

## 2024-02-05 DIAGNOSIS — O0990 Supervision of high risk pregnancy, unspecified, unspecified trimester: Secondary | ICD-10-CM | POA: Insufficient documentation

## 2024-02-05 DIAGNOSIS — O0991 Supervision of high risk pregnancy, unspecified, first trimester: Secondary | ICD-10-CM

## 2024-02-05 MED ORDER — DOXYLAMINE-PYRIDOXINE 10-10 MG PO TBEC: 2.0000 | DELAYED_RELEASE_TABLET | Freq: Every day | ORAL | 5 refills | Status: AC

## 2024-02-05 MED ORDER — PROMETHAZINE HCL 25 MG PO TABS: 25.0000 mg | ORAL_TABLET | Freq: Four times a day (QID) | ORAL | 1 refills | Status: AC | PRN

## 2024-02-05 MED ORDER — VITAFOL GUMMIES 3.33-0.333-34.8 MG PO CHEW: 3.0000 | CHEWABLE_TABLET | Freq: Every day | ORAL | 11 refills | Status: AC

## 2024-02-05 NOTE — Progress Notes (Signed)
 New OB Intake  I connected with Brianna Thornton  on 02/05/24 at  2:10 PM EST by In Person Visit and verified that I am speaking with the correct person using two identifiers. Nurse is located at [facility] and pt is located at [facility].  I discussed the limitations, risks, security and privacy concerns of performing an evaluation and management service by telephone and the availability of in person appointments. I also discussed with the patient that there may be a patient responsible charge related to this service. The patient expressed understanding and agreed to proceed.  I explained I am completing New OB Intake today. We discussed EDD of 09/20/24 based on US  at [redacted]w[redacted]d weeks. Pt is [id number]. I reviewed her allergies, medications and Medical/Surgical/OB history.    Patient Active Problem List   Diagnosis Date Noted   Vitamin D  deficiency 06/04/2023   Ruptured right tubal ectopic pregnancy causing hemoperitoneum    Shoulder dystocia during labor and delivery 07/08/2021   History of COVID-19 04/10/2021     Concerns addressed today  Delivery Plans Plans to deliver at [facility]. Discussed the nature of our practice with multiple providers including residents and students as well as female and female providers. Due to the size of the practice, the delivering provider may not be the same as those providing prenatal care.   Patient is interested in water birth.  MyChart/Babyscripts MyChart access verified. I explained pt will have some visits in office and some virtually. Babyscripts instructions given and order placed. Patient verifies receipt of registration text/e-mail. Account successfully created and app downloaded. If patient is a candidate for Optimized scheduling, add to sticky note.   Blood Pressure Cuff/Weight Scale Pt has BP cuff at home. Explained after first prenatal appt pt will check weekly and document in Babyscripts. Patient does not have weight scale; patient may purchase if they desire  to track weight weekly in Babyscripts.  Anatomy US  Explained first scheduled US  will be around 19 weeks. Anatomy US  scheduled for TBD at TBD.  Is patient a candidate for Babyscripts Optimization? No, due to Risk Factors   First visit review I reviewed new OB appt with patient. Explained pt will be seen by Nidia Daring, NP at first visit. Discussed [place] genetic screening with patient. Requests Panorama. Routine prenatal labs OB Urine, GC/CC collected at today's visit. Initial prenatal labs deferred to New OB appt.   Last Pap Diagnosis  Date Value Ref Range Status  03/05/2023   Final   - Negative for intraepithelial lesion or malignancy (NILM)    Brianna CHRISTELLA Ober, RN 02/05/2024  2:52 PM

## 2024-02-06 LAB — CERVICOVAGINAL ANCILLARY ONLY
Chlamydia: NEGATIVE
Comment: NEGATIVE
Comment: NORMAL
Neisseria Gonorrhea: NEGATIVE

## 2024-02-06 NOTE — Patient Instructions (Signed)
[facility] has a partnership with the [employer] to provide prenatal navigation for the most needed resources in our community. In order to see how we can help connect you to these resources we need consent to contact you. Please complete the very short consent using the link below:   English Link: [email or web address]  Spanish Link: [email or web address]?[email or web address]  

## 2024-02-08 LAB — URINE CULTURE, OB REFLEX

## 2024-02-08 LAB — CULTURE, OB URINE

## 2024-02-11 ENCOUNTER — Other Ambulatory Visit

## 2024-02-11 DIAGNOSIS — Z006 Encounter for examination for normal comparison and control in clinical research program: Secondary | ICD-10-CM

## 2024-02-24 LAB — GENECONNECT MOLECULAR SCREEN: Genetic Analysis Overall Interpretation: NEGATIVE

## 2024-03-09 ENCOUNTER — Other Ambulatory Visit (HOSPITAL_COMMUNITY)
Admission: RE | Admit: 2024-03-09 | Discharge: 2024-03-09 | Disposition: A | Source: Ambulatory Visit | Attending: Obstetrics and Gynecology | Admitting: Obstetrics and Gynecology

## 2024-03-09 ENCOUNTER — Encounter: Payer: Self-pay | Admitting: Obstetrics and Gynecology

## 2024-03-09 ENCOUNTER — Ambulatory Visit: Admitting: Obstetrics and Gynecology

## 2024-03-09 VITALS — BP 102/57 | HR 78 | Wt 95.0 lb

## 2024-03-09 DIAGNOSIS — N949 Unspecified condition associated with female genital organs and menstrual cycle: Secondary | ICD-10-CM | POA: Diagnosis not present

## 2024-03-09 DIAGNOSIS — N898 Other specified noninflammatory disorders of vagina: Secondary | ICD-10-CM | POA: Insufficient documentation

## 2024-03-09 DIAGNOSIS — Z8759 Personal history of other complications of pregnancy, childbirth and the puerperium: Secondary | ICD-10-CM

## 2024-03-09 DIAGNOSIS — Z3A12 12 weeks gestation of pregnancy: Secondary | ICD-10-CM

## 2024-03-09 DIAGNOSIS — O0991 Supervision of high risk pregnancy, unspecified, first trimester: Secondary | ICD-10-CM

## 2024-03-09 DIAGNOSIS — O0990 Supervision of high risk pregnancy, unspecified, unspecified trimester: Secondary | ICD-10-CM

## 2024-03-09 NOTE — Addendum Note (Signed)
 Addended by: Shakura Cowing V on: 03/09/2024 05:04 PM   Modules accepted: Orders

## 2024-03-09 NOTE — Progress Notes (Signed)
 Pt presents for NOB visit. Pt c/o pain on the right side and back pain

## 2024-03-09 NOTE — Progress Notes (Signed)
 INITIAL PRENATAL VISIT  History:  Brianna Thornton is a 23 y.o. [phone] at [redacted]w[redacted]d by LMP being seen today for her first obstetrical visit.  Her obstetrical history is significant for none. Patient does intend to breast feed. Pregnancy history fully reviewed.  Patient reports right sided lower abdominal pain. Patient describes pain as sharp and intermittent. She has not tried taking anything for the pain.[name]  HISTORY: OB History  Gravida Para Term Preterm AB Living  5 1 1  0 3 1  SAB IAB Ectopic Multiple Live Births  2 0 1 0 1    # Outcome Date GA Lbr Len/2nd Weight Sex Type Anes PTL Lv  5 Current           4 Term 07/06/21 [redacted]w[redacted]d 09:34 / 00:46 3580 g M Vag-Spont EPI  LIV     Birth Comments: none     Name: Coggeshall,BOY Hayla     Apgar1: 4  Apgar5: 7  3 Ectopic           2 SAB           1 SAB             Last pap smear was done 03/05/2023 and was normal  Past Medical History:  Diagnosis Date   Asthma    Depression    therapist said bipolar, pt does not want to take meds   Ovarian cyst    Past Surgical History:  Procedure Laterality Date   ECTOPIC PREGNANCY SURGERY     LAPAROSCOPY N/A 01/24/2022   Procedure: LAPAROSCOPY DIAGNOSTIC,RIGHT SALPINGECTOMY FOR RUPTURED ECTOPIC;  Surgeon: Alger Gong, MD;  Location: [facility];  Service: Gynecology;  Laterality: N/A;   Family History  Problem Relation Age of Onset   Bipolar disorder Mother    Healthy Father    Social History[1] Allergies[2] Medications Ordered Prior to Encounter[3]  Review of Systems Pertinent items noted in HPI and remainder of comprehensive ROS otherwise negative.  Indications for ASA therapy (per UpToDate) One of the following: (Needs 162 mg daily) Previous pregnancy with preeclampsia, especially early onset and with an adverse outcome Yes Chronic hypertension No Type 1 or 2 diabetes mellitus No Multifetal gestation No Chronic kidney disease No Autoimmune disease (antiphospholipid syndrome, systemic lupus  erythematosus) No Two or more of the following: (Can do 81 mg daily) Nulliparity No Obesity (body mass index >30 kg/m2) No Family history of preeclampsia in mother or sister No Age >=35 years No Sociodemographic characteristics (African American race, low socioeconomic level) Yes Personal risk factors (eg, previous pregnancy with low birth weight or small for gestational age infant, previous adverse pregnancy outcome [eg, stillbirth], interval >10 years between pregnancies) No In vitro conception No  Physical Exam:   Vitals:   03/09/24 1338  BP: (!) 102/57  Pulse: 78  Weight: 43.1 kg   Fetal Heart Rate (bpm): 154   General: well-developed, well-nourished female in no acute distress  Breasts:  Deferred  Skin: normal coloration and turgor, no rashes  Neurologic: oriented, normal, negative, normal mood  Extremities: normal strength, tone, and muscle mass, ROM of all joints is normal  HEENT PERRLA, extraocular movement intact and sclera clear, anicteric  Neck supple and no masses  Cardiovascular: regular rate and rhythm  Respiratory:  no respiratory distress, normal breath sounds  Abdomen: soft, non-tender; bowel sounds normal; no masses,  no organomegaly  Pelvic: Deferred   Assessment:  Pregnancy: [id number] Patient Active Problem List   Diagnosis Date Noted   Supervision  of high risk pregnancy, antepartum 02/05/2024   Ruptured right tubal ectopic pregnancy causing hemoperitoneum    History of shoulder dystocia in prior pregnancy 07/08/2021    Plan:  1. Supervision of high risk pregnancy, antepartum (Primary) - Doing well -BP normotensive  2. [redacted] weeks gestation of pregnancy - Routine OB care  -AFP at next visit  3. History of shoulder dystocia in prior pregnancy - Hx of shoulder dystocia in 2023 - EFW 3500 grams  4. Vaginal discharge -vaginal swab completed today   5. Round ligament pain -Patient can take tylenol  1000 mg as needed for pain  Initial labs  drawn. Continue prenatal vitamins. Problem list reviewed and updated. Genetic Screening discussed, Panorama: ordered. Ultrasound discussed; fetal anatomic survey: scheduled. 05/03/2024 Anticipatory guidance about prenatal visits given including labs, ultrasounds, and testing. Weight gain recommendations per IOM guidelines reviewed: underweight/BMI 18.5 or less > 28 - 40 lbs; normal weight/BMI 18.5 - 24.9 > 25 - 35 lbs; overweight/BMI 25 - 29.9 > 15 - 25 lbs; obese/BMI 30 or more > 11 - 20 lbs. Discussed usage of the Babys[employer]s app for more information about pregnancy, and to track blood pressures. Also discussed usage of virtual visits as additional source of managing and completing prenatal visits.  Patient was encouraged to use MyChart to review results, send requests, and have questions addressed.   The nature of Center for [facility] with multiple MDs and Advanced Practice Providers was explained to patient; also emphasized that residents, students are part of our team.  Also emphasized that we do have female providers; female-only providers cannot be guaranteed. Routine obstetric precautions reviewed. Encouraged to seek out care at our office or emergency room [facility] preferred) for urgent and/or emergent concerns. Return in about 4 weeks (around 04/06/2024) for ROB.     Derrek JINNY Freund, NP [facility] [employer]     [1]  Social History Tobacco Use   Smoking status: Never   Smokeless tobacco: Never  Vaping Use   Vaping status: Never Used  Substance Use Topics   Alcohol use: Not Currently    Comment: occasionally   Drug use: Not Currently    Types: Marijuana  [2] No Known Allergies [3]  Current Outpatient Medications on File Prior to Visit  Medication Sig Dispense Refill   albuterol (VENTOLIN HFA) 108 (90 Base) MCG/ACT inhaler Inhale 2 puffs into the lungs every 4 (four) hours as needed for wheezing or shortness of  breath (allergies).     Doxylamine -Pyridoxine  (DICLEGIS ) 10-10 MG TBEC Take 2 tablets by mouth at bedtime. If symptoms persist, add one tablet in the morning and one in the afternoon 100 tablet 5   escitalopram (LEXAPRO) 10 MG tablet Take 20 mg by mouth daily.     Prenatal Vit-Fe Phos-FA-Omega (VITAFOL  GUMMIES) 3.33-0.333-34.8 MG CHEW Chew 3 tablets by mouth daily. 90 tablet 11   promethazine  (PHENERGAN ) 25 MG tablet Take 1 tablet (25 mg total) by mouth every 6 (six) hours as needed for nausea or vomiting. 30 tablet 1   Vitamin D , Ergocalciferol , (DRISDOL ) 1.25 MG (50000 UNIT) CAPS capsule Take 1 capsule (50,000 Units total) by mouth every 7 (seven) days. (Patient not taking: Reported on 03/09/2024) 12 capsule 4   No current facility-administered medications on file prior to visit.

## 2024-03-09 NOTE — Progress Notes (Deleted)
° °  PRENATAL VISIT NOTE  Subjective:  Brianna Thornton is a 23 y.o. [phone] at [redacted]w[redacted]d being seen today for ongoing prenatal care.  She is currently monitored for the following issues for this low-risk pregnancy and has History of COVID-19; Shoulder dystocia during labor and delivery; Ruptured right tubal ectopic pregnancy causing hemoperitoneum; Vitamin D  deficiency; and Supervision of high risk pregnancy, antepartum on their problem list.  Patient reports {sx:14538}.   .  .   . Denies leaking of fluid.   The following portions of the patient's history were reviewed and updated as appropriate: allergies, current medications, past family history, past medical history, past social history, past surgical history and problem list.   Objective:   There were no vitals filed for this visit.  Fetal Status:           General: Alert, oriented and cooperative. Patient is in no acute distress.  Skin: Skin is warm and dry. No rash noted.   Cardiovascular: Normal heart rate noted  Respiratory: Normal respiratory effort, no problems with respiration noted  Abdomen: Soft, gravid, appropriate for gestational age.        Pelvic: {Blank single:19197::Cervical exam performed in the presence of a chaperone,Cervical exam deferred}        Extremities: Normal range of motion.     Mental Status: Normal mood and affect. Normal behavior. Normal judgment and thought content.      06/09/2021    9:57 AM 03/23/2021    9:31 AM 01/12/2021    9:16 AM  Depression screen PHQ 2/9  Decreased Interest 0 0 0  Down, Depressed, Hopeless 0 1 1  PHQ - 2 Score 0 1 1  Altered sleeping 0 2 1  Tired, decreased energy 0 2 2  Change in appetite 0 0 3  Feeling bad or failure about yourself  0 0 0  Trouble concentrating 0 0 0  Moving slowly or fidgety/restless 0 0 0  Suicidal thoughts 0 0 0  PHQ-9 Score 0  5  7   Difficult doing work/chores Not difficult at all  Somewhat difficult     Data saved with a previous flowsheet row  definition        03/23/2021    9:32 AM 01/12/2021    9:18 AM 01/25/2017   10:00 AM  GAD 7 : Generalized Anxiety Score  Nervous, Anxious, on Edge 0 0 1  Control/stop worrying 0 0 0  Worry too much - different things 0 0 0  Trouble relaxing 0 0 0  Restless 0 0 0  Easily annoyed or irritable 3 2 3   Afraid - awful might happen 0 0 1  Total GAD 7 Score 3 2 5   Anxiety Difficulty  Not difficult at all     Assessment and Plan:  Pregnancy: G5P1031 at [redacted]w[redacted]d 1. Supervision of high risk pregnancy, antepartum (Primary) ***  2. Shoulder dystocia during labor and delivery ***  {Blank single:19197::Term,Preterm} labor symptoms and general obstetric precautions including but not limited to vaginal bleeding, contractions, leaking of fluid and fetal movement were reviewed in detail with the patient. Please refer to After Visit Summary for other counseling recommendations.   No follow-ups on file.  Future Appointments  Date Time Provider Department Center  03/09/2024  1:30 PM Delores Nidia CROME, FNP [facility] None  05/04/2024  8:00 AM [facility] PROVIDER 1 [facility]  05/04/2024  8:30 AM [facility] US3 [facility]    Derrek JINNY Freund, RN

## 2024-03-10 LAB — CBC/D/PLT+RPR+RH+ABO+RUBIGG...
Antibody Screen: NEGATIVE
Basophils Absolute: 0 x10E3/uL (ref 0.0–0.2)
Basos: 0 %
EOS (ABSOLUTE): 0.2 x10E3/uL (ref 0.0–0.4)
Eos: 2 %
HCV Ab: NONREACTIVE
HIV Screen 4th Generation wRfx: NONREACTIVE
Hematocrit: 33.4 % — ABNORMAL LOW (ref 34.0–46.6)
Hemoglobin: 11 g/dL — ABNORMAL LOW (ref 11.1–15.9)
Hepatitis B Surface Ag: NEGATIVE
Immature Grans (Abs): 0 x10E3/uL (ref 0.0–0.1)
Immature Granulocytes: 0 %
Lymphocytes Absolute: 2.3 x10E3/uL (ref 0.7–3.1)
Lymphs: 17 %
MCH: 29.6 pg (ref 26.6–33.0)
MCHC: 32.9 g/dL (ref 31.5–35.7)
MCV: 90 fL (ref 79–97)
Monocytes Absolute: 0.7 x10E3/uL (ref 0.1–0.9)
Monocytes: 5 %
Neutrophils Absolute: 10.6 x10E3/uL — ABNORMAL HIGH (ref 1.4–7.0)
Neutrophils: 76 %
Platelets: 352 x10E3/uL (ref 150–450)
RBC: 3.72 x10E6/uL — ABNORMAL LOW (ref 3.77–5.28)
RDW: 12.5 % (ref 11.7–15.4)
RPR Ser Ql: NONREACTIVE
Rh Factor: POSITIVE
Rubella Antibodies, IGG: 2.05 {index} (ref >0.99)
WBC: 13.9 x10E3/uL — ABNORMAL HIGH (ref 3.4–10.8)

## 2024-03-10 LAB — COMPREHENSIVE METABOLIC PANEL WITH GFR
ALT: 7 IU/L (ref 0–32)
AST: 13 IU/L (ref 0–40)
Albumin: 4 g/dL (ref 4.0–5.0)
Alkaline Phosphatase: 64 IU/L (ref 41–116)
BUN/Creatinine Ratio: 8 — ABNORMAL LOW (ref 9–23)
BUN: 4 mg/dL — ABNORMAL LOW (ref 6–20)
Bilirubin Total: 0.3 mg/dL (ref 0.0–1.2)
CO2: 21 mmol/L (ref 20–29)
Calcium: 9.1 mg/dL (ref 8.7–10.2)
Chloride: 103 mmol/L (ref 96–106)
Creatinine, Ser: 0.48 mg/dL — ABNORMAL LOW (ref 0.57–1.00)
Globulin, Total: 2.4 g/dL (ref 1.5–4.5)
Glucose: 77 mg/dL (ref 70–99)
Potassium: 3.5 mmol/L (ref 3.5–5.2)
Sodium: 140 mmol/L (ref 134–144)
Total Protein: 6.4 g/dL (ref 6.0–8.5)
eGFR: 137 mL/min/1.73 (ref >59)

## 2024-03-10 LAB — HCV INTERPRETATION

## 2024-03-10 LAB — HEMOGLOBIN A1C
Est. average glucose Bld gHb Est-mCnc: 97 mg/dL
Hgb A1c MFr Bld: 5 % (ref 4.8–5.6)

## 2024-03-11 LAB — CERVICOVAGINAL ANCILLARY ONLY
Bacterial Vaginitis (gardnerella): NEGATIVE
Candida Glabrata: NEGATIVE
Candida Vaginitis: POSITIVE — AB
Chlamydia: NEGATIVE
Comment: NEGATIVE
Comment: NEGATIVE
Comment: NEGATIVE
Comment: NEGATIVE
Comment: NEGATIVE
Comment: NORMAL
Neisseria Gonorrhea: NEGATIVE
Trichomonas: NEGATIVE

## 2024-03-13 ENCOUNTER — Ambulatory Visit: Payer: Self-pay | Admitting: Obstetrics and Gynecology

## 2024-03-13 DIAGNOSIS — O0990 Supervision of high risk pregnancy, unspecified, unspecified trimester: Secondary | ICD-10-CM

## 2024-03-13 DIAGNOSIS — B3731 Acute candidiasis of vulva and vagina: Secondary | ICD-10-CM

## 2024-03-13 MED ORDER — TERCONAZOLE 0.4 % VA CREA: 1.0000 | TOPICAL_CREAM | Freq: Every day | VAGINAL | 0 refills | Status: AC

## 2024-03-15 LAB — PANORAMA PRENATAL TEST FULL PANEL:PANORAMA TEST PLUS 5 ADDITIONAL MICRODELETIONS: FETAL FRACTION: 16.7

## 2024-04-06 ENCOUNTER — Ambulatory Visit: Payer: Self-pay | Admitting: Obstetrics and Gynecology

## 2024-04-06 ENCOUNTER — Encounter: Payer: Self-pay | Admitting: Obstetrics and Gynecology

## 2024-04-06 VITALS — BP 96/56 | HR 74 | Wt 98.4 lb

## 2024-04-06 DIAGNOSIS — Z3A16 16 weeks gestation of pregnancy: Secondary | ICD-10-CM | POA: Diagnosis not present

## 2024-04-06 DIAGNOSIS — O0990 Supervision of high risk pregnancy, unspecified, unspecified trimester: Secondary | ICD-10-CM

## 2024-04-06 DIAGNOSIS — O0992 Supervision of high risk pregnancy, unspecified, second trimester: Secondary | ICD-10-CM

## 2024-04-06 NOTE — Progress Notes (Signed)
" ° °  PRENATAL VISIT NOTE  Subjective:  Brianna Thornton is a 24 y.o. [phone] at [redacted]w[redacted]d being seen today for ongoing prenatal care.  She is currently monitored for the following issues for this low-risk pregnancy and has History of shoulder dystocia in prior pregnancy; Ruptured right tubal ectopic pregnancy causing hemoperitoneum; and Supervision of high risk pregnancy, antepartum on their problem list.  Patient reports no complaints.  Contractions: Not present. Vag. Bleeding: None.  Movement: Present. Denies leaking of fluid.   The following portions of the patient's history were reviewed and updated as appropriate: allergies, current medications, past family history, past medical history, past social history, past surgical history and problem list.   Objective:   Vitals:   04/06/24 1032  BP: (!) 96/56  Pulse: 74  Weight: 98 lb 6.4 oz (44.6 kg)    Fetal Status:  Fetal Heart Rate (bpm): 147   Movement: Present    General: Alert, oriented and cooperative. Patient is in no acute distress.  Skin: Skin is warm and dry. No rash noted.   Cardiovascular: Normal heart rate noted  Respiratory: Normal respiratory effort, no problems with respiration noted  Abdomen: Soft, gravid, appropriate for gestational age.  Pain/Pressure: Present     Pelvic: Cervical exam deferred        Extremities: Normal range of motion.  Edema: None  Mental Status: Normal mood and affect. Normal behavior. Normal judgment and thought content.     Assessment and Plan:  Pregnancy: G5P1031 at [redacted]w[redacted]d 1. Supervision of high risk pregnancy, antepartum (Primary) BP and FHR normal Doing well, started feeling movement  2. [redacted] weeks gestation of pregnancy Declined AFP  Anatomy u/s 2/9 Will schedule with midwife, desires waterbirth, already completed class last pregnancy   Preterm labor symptoms and general obstetric precautions including but not limited to vaginal bleeding, contractions, leaking of fluid and fetal movement were  reviewed in detail with the patient. Please refer to After Visit Summary for other counseling recommendations.   Return in about 4 weeks (around 05/04/2024), or schedule with midwife.  Future Appointments  Date Time Provider Department Center  05/04/2024  8:00 AM [facility] PROVIDER 1 [facility]  05/04/2024  8:30 AM [facility] US3 [facility]  05/12/2024 10:15 AM [facility], CNM [facility] None    Nidia Daring, FNP  "

## 2024-04-06 NOTE — Progress Notes (Signed)
Pt presents for ROB visit. Declines AFP

## 2024-04-10 ENCOUNTER — Inpatient Hospital Stay (HOSPITAL_COMMUNITY)
Admission: AD | Admit: 2024-04-10 | Discharge: 2024-04-10 | Disposition: A | Discharge status: Home / Self Care | Attending: Obstetrics and Gynecology | Admitting: Obstetrics and Gynecology

## 2024-04-10 ENCOUNTER — Other Ambulatory Visit: Payer: Self-pay

## 2024-04-10 ENCOUNTER — Inpatient Hospital Stay (HOSPITAL_COMMUNITY)

## 2024-04-10 DIAGNOSIS — N949 Unspecified condition associated with female genital organs and menstrual cycle: Secondary | ICD-10-CM

## 2024-04-10 DIAGNOSIS — R1030 Lower abdominal pain, unspecified: Secondary | ICD-10-CM | POA: Insufficient documentation

## 2024-04-10 DIAGNOSIS — R197 Diarrhea, unspecified: Secondary | ICD-10-CM | POA: Insufficient documentation

## 2024-04-10 DIAGNOSIS — O26892 Other specified pregnancy related conditions, second trimester: Secondary | ICD-10-CM | POA: Insufficient documentation

## 2024-04-10 DIAGNOSIS — O0990 Supervision of high risk pregnancy, unspecified, unspecified trimester: Secondary | ICD-10-CM

## 2024-04-10 DIAGNOSIS — O26852 Spotting complicating pregnancy, second trimester: Secondary | ICD-10-CM | POA: Insufficient documentation

## 2024-04-10 DIAGNOSIS — Z3A17 17 weeks gestation of pregnancy: Secondary | ICD-10-CM | POA: Diagnosis not present

## 2024-04-10 DIAGNOSIS — O4692 Antepartum hemorrhage, unspecified, second trimester: Secondary | ICD-10-CM

## 2024-04-10 DIAGNOSIS — O219 Vomiting of pregnancy, unspecified: Secondary | ICD-10-CM | POA: Insufficient documentation

## 2024-04-10 LAB — URINALYSIS, ROUTINE W REFLEX MICROSCOPIC
Bilirubin Urine: NEGATIVE
Glucose, UA: NEGATIVE mg/dL
Hgb urine dipstick: NEGATIVE
Ketones, ur: NEGATIVE mg/dL
Leukocytes,Ua: NEGATIVE
Nitrite: NEGATIVE
Protein, ur: NEGATIVE mg/dL
Specific Gravity, Urine: 1.006 (ref 1.005–1.030)
pH: 7 (ref 5.0–8.0)

## 2024-04-10 LAB — WET PREP, GENITAL
Clue Cells Wet Prep HPF POC: NONE SEEN
Sperm: NONE SEEN
Trich, Wet Prep: NONE SEEN
WBC, Wet Prep HPF POC: <10
Yeast Wet Prep HPF POC: NONE SEEN

## 2024-04-10 MED ORDER — CYCLOBENZAPRINE HCL 5 MG PO TABS: 5.0000 mg | ORAL_TABLET | Freq: Three times a day (TID) | ORAL | 0 refills | Status: AC | PRN

## 2024-04-10 NOTE — MAU Provider Note (Signed)
 " None     S [name] is a 24 y.o. [phone] pregnant female at [redacted]w[redacted]d who presents to MAU today with complaint of intermittent spotting since yesterday along with lower abdominal pain. She indicates pain is LLQ and worse with movement She denies LOF. She reports multiple episodes of emesis yesterday and today. She has had 1 watery stools recently, but this just started. She denies sick contacts, reports that her nausea and vomiting of pregnancy had resolved and so she had not taken medication. Declines medication here.   Receives care at [facility]. Prenatal records reviewed.  Pertinent items noted in HPI and remainder of comprehensive ROS otherwise negative.   O BP (!) 97/54 (BP Location: Right Arm)   Pulse 72   Temp 98.3 F (36.8 C) (Oral)   Resp 20   Ht 5' 1.5 (1.562 m)   Wt 44.3 kg   LMP 12/10/2023 (Exact Date)   SpO2 99%   BMI 18.16 kg/m  Physical Exam Vitals and nursing note reviewed.  Constitutional:      General: She is not in acute distress.    Appearance: Normal appearance. She is not ill-appearing, toxic-appearing or diaphoretic.  HENT:     Head: Normocephalic.  Cardiovascular:     Rate and Rhythm: Normal rate and regular rhythm.     Pulses: Normal pulses.     Heart sounds: Normal heart sounds.  Pulmonary:     Effort: Pulmonary effort is normal.     Breath sounds: Normal breath sounds.  Abdominal:     Palpations: Abdomen is soft.     Tenderness: There is abdominal tenderness in the left lower quadrant. There is no guarding or rebound.      Comments: Gravid   Skin:    General: Skin is warm and dry.     Capillary Refill: Capillary refill takes less than 2 seconds.  Neurological:     General: No focal deficit present.     Mental Status: She is alert and oriented to person, place, and time.      MDM:  Moderate No vaginal or urinary tract infection suspected based on UA and wet prep. Ultrasound reassuring, no previa or shortening of cervix.  Patient  well hydrated here.  Massage for RLP effective for some lower abdominal pain.  No current bleeding.   MAU Course:  A Supervision of high risk pregnancy, antepartum  Nausea/vomiting in pregnancy - Plan: Discharge patient  Diarrhea of presumed infectious origin  [redacted] weeks gestation of pregnancy  Medical screening exam complete  P Discharge from MAU in stable condition with return precautions Follow up at [facility] as scheduled for ongoing prenatal care Flexeril  5mg  PO q8h PRN for pain relief prescribed.  Safe medications in pregnancy discussed.   Allergies as of 04/10/2024   No Known Allergies      Medication List     TAKE these medications    albuterol 108 (90 Base) MCG/ACT inhaler Commonly known as: VENTOLIN HFA Inhale 2 puffs into the lungs every 4 (four) hours as needed for wheezing or shortness of breath (allergies).   cyclobenzaprine  5 MG tablet Commonly known as: FLEXERIL  Take 1 tablet (5 mg total) by mouth 3 (three) times daily as needed.   Doxylamine -Pyridoxine  10-10 MG Tbec Commonly known as: Diclegis  Take 2 tablets by mouth at bedtime. If symptoms persist, add one tablet in the morning and one in the afternoon   escitalopram 10 MG tablet Commonly known as: LEXAPRO Take 20 mg by mouth  daily.   promethazine  25 MG tablet Commonly known as: PHENERGAN  Take 1 tablet (25 mg total) by mouth every 6 (six) hours as needed for nausea or vomiting.   Vitafol  Gummies 3.33-0.333-34.8 MG Chew Chew 3 tablets by mouth daily.   Vitamin D  (Ergocalciferol ) 1.25 MG (50000 UNIT) Caps capsule Commonly known as: DRISDOL  Take 1 capsule (50,000 Units total) by mouth every 7 (seven) days.        Camie Rote, MSN, CNM 04/10/2024 8:31 PM  Certified Nurse Midwife, [facility]  "

## 2024-04-10 NOTE — Discharge Instructions (Addendum)
 Safe Medications in Pregnancy   Acne:  Benzoyl Peroxide  Salicylic Acid   Backache/Headache:  Tylenol : 2 regular strength every 4 hours OR               2 Extra strength every 6 hours   Colds/Coughs/Allergies:  Benadryl  (alcohol free) 25 mg every 6 hours as needed  Breath right strips  Claritin  Cepacol throat lozenges  Chloraseptic throat spray  Cold-Eeze- up to three times per day  Cough drops, alcohol free  Flonase (by prescription only)  Guaifenesin  Mucinex  Robitussin DM (plain only, alcohol free)  Saline nasal spray/drops  Sudafed (pseudoephedrine) & Actifed * use only after [redacted] weeks gestation and if you do not have high blood pressure  Tylenol   Vicks Vaporub  Zinc lozenges  Zyrtec   Constipation:  Colace  Ducolax suppositories  Fleet enema  Glycerin  suppositories  Metamucil  Milk of magnesia  Miralax  Senokot  Smooth move tea   Diarrhea:  Kaopectate  Imodium A-D   *NO pepto Bismol   Hemorrhoids:  Anusol  Anusol HC  Preparation H  Tucks   Indigestion:  Tums  Maalox  Mylanta  Zantac  Pepcid    Insomnia:  Benadryl  (alcohol free) 25mg  every 6 hours as needed  Tylenol  PM  Unisom , no Gelcaps   Leg Cramps:  Tums  MagGel   Nausea/Vomiting:  Bonine  Dramamine  Emetrol  Ginger extract  Sea bands  Meclizine  Nausea medication to take during pregnancy:  Unisom  (doxylamine  succinate 25 mg tablets) Take one tablet daily at bedtime. If symptoms are not adequately controlled, the dose can be increased to a maximum recommended dose of two tablets daily (1/2 tablet in the morning, 1/2 tablet mid-afternoon and one at bedtime).  Vitamin B6 100mg  tablets. Take one tablet twice a day (up to 200 mg per day).   Skin Rashes:  Aveeno products  Benadryl  cream or 25mg  every 6 hours as needed  Calamine Lotion  1% cortisone cream   Yeast infection:  Gyne-lotrimin 7  Monistat 7    **If taking multiple medications, please check labels to avoid  duplicating the same active ingredients  **take medication as directed on the label  ** Do not exceed 4000 mg of tylenol  in 24 hours  **Do not take medications that contain aspirin or ibuprofen           Comfort Measures for Round Ligament Pain:  Soak in a warm tub Tylenol  1000 mg by mouth every 6-8 hrs as needed for pain Only take Flexeril  that was prescribed at bedtime for pain that is not relieved by Tylenol  Slow position changes Do pelvic massages and chair stretches before getting out of bed and before getting in bed (as demonstrated to you) Laying on the affected side Apply heat only to affected area in increments of 15 mins on and 15 mins off x 1 hour then off for 1 hour completely before repeating the cycle

## 2024-04-10 NOTE — MAU Note (Signed)
 Brianna Thornton is a 24 y.o. at [redacted]w[redacted]d here in MAU reporting: she's had intermittent spotting since yesterday accompanied with lower back pain.  Reports back pain is an intermittent discomfort.  Denies recent intercourse.   LMP: 12/10/2023 Onset of complaint: yesterday Pain score: 5 Vitals:   04/10/24 1637  BP: (!) 97/54  Pulse: 72  Resp: 20  Temp: 98.3 F (36.8 C)  SpO2: 99%     FHT: 144 bpm  Lab orders placed from triage: UA

## 2024-04-13 LAB — GC/CHLAMYDIA PROBE AMP (~~LOC~~) NOT AT ARMC
Chlamydia: NEGATIVE
Comment: NEGATIVE
Comment: NORMAL
Neisseria Gonorrhea: NEGATIVE

## 2024-05-04 ENCOUNTER — Ambulatory Visit

## 2024-05-04 ENCOUNTER — Other Ambulatory Visit

## 2024-05-04 DIAGNOSIS — O0990 Supervision of high risk pregnancy, unspecified, unspecified trimester: Secondary | ICD-10-CM

## 2024-05-12 ENCOUNTER — Encounter: Payer: Self-pay | Admitting: Advanced Practice Midwife

## 2024-09-15 ENCOUNTER — Inpatient Hospital Stay (HOSPITAL_COMMUNITY): Admit: 2024-09-15
